# Patient Record
Sex: Female | Born: 1965 | Hispanic: Yes | Marital: Single | Smoking: Never smoker
Health system: Southern US, Community
[De-identification: ages and names within clinical notes are randomized; demographics above are authoritative.]

## PROBLEM LIST (undated history)

## (undated) ENCOUNTER — Ambulatory Visit (HOSPITAL_COMMUNITY): Admission: EM | Payer: Self-pay | Source: Home / Self Care

## (undated) DIAGNOSIS — J45909 Unspecified asthma, uncomplicated: Secondary | ICD-10-CM

## (undated) DIAGNOSIS — I10 Essential (primary) hypertension: Secondary | ICD-10-CM

## (undated) HISTORY — PX: OVARIAN CYST REMOVAL: SHX89

---

## 2019-09-15 ENCOUNTER — Encounter (HOSPITAL_COMMUNITY): Payer: Self-pay

## 2019-09-15 ENCOUNTER — Ambulatory Visit (INDEPENDENT_AMBULATORY_CARE_PROVIDER_SITE_OTHER): Payer: Managed Care, Other (non HMO)

## 2019-09-15 ENCOUNTER — Other Ambulatory Visit: Payer: Self-pay

## 2019-09-15 ENCOUNTER — Ambulatory Visit (HOSPITAL_COMMUNITY)
Admission: EM | Admit: 2019-09-15 | Discharge: 2019-09-15 | Disposition: A | Discharge status: Home / Self Care | Payer: Managed Care, Other (non HMO) | Attending: Family Medicine | Admitting: Family Medicine

## 2019-09-15 DIAGNOSIS — U071 COVID-19: Secondary | ICD-10-CM | POA: Insufficient documentation

## 2019-09-15 DIAGNOSIS — R05 Cough: Secondary | ICD-10-CM

## 2019-09-15 DIAGNOSIS — I10 Essential (primary) hypertension: Secondary | ICD-10-CM | POA: Insufficient documentation

## 2019-09-15 DIAGNOSIS — J4541 Moderate persistent asthma with (acute) exacerbation: Secondary | ICD-10-CM | POA: Diagnosis present

## 2019-09-15 DIAGNOSIS — R0602 Shortness of breath: Secondary | ICD-10-CM | POA: Diagnosis present

## 2019-09-15 DIAGNOSIS — R509 Fever, unspecified: Secondary | ICD-10-CM | POA: Diagnosis not present

## 2019-09-15 HISTORY — DX: Unspecified asthma, uncomplicated: J45.909

## 2019-09-15 HISTORY — DX: Essential (primary) hypertension: I10

## 2019-09-15 MED ORDER — ACETAMINOPHEN 325 MG PO TABS
975.0000 mg | ORAL_TABLET | Freq: Once | ORAL | Status: AC
  Administered 2019-09-15: 975 mg via ORAL

## 2019-09-15 MED ORDER — ACETAMINOPHEN 325 MG PO TABS
ORAL_TABLET | ORAL | Status: AC
  Filled 2019-09-15: qty 3

## 2019-09-15 MED ORDER — AZITHROMYCIN 250 MG PO TABS
250.0000 mg | ORAL_TABLET | Freq: Every day | ORAL | 0 refills | Status: DC
Start: 2019-09-15 — End: 2019-10-03

## 2019-09-15 MED ORDER — LISINOPRIL 10 MG PO TABS
10.0000 mg | ORAL_TABLET | Freq: Every day | ORAL | 0 refills | Status: DC
Start: 2019-09-15 — End: 2019-10-03

## 2019-09-15 MED ORDER — PREDNISONE 10 MG (21) PO TBPK
ORAL_TABLET | Freq: Every day | ORAL | 0 refills | Status: DC
Start: 2019-09-15 — End: 2019-10-03

## 2019-09-15 NOTE — ED Triage Notes (Signed)
Pt presents with severe non productive cough and shortness of breath X 1 week.

## 2019-09-15 NOTE — Discharge Instructions (Addendum)
You have been tested for COVID-19 today. °If your test returns positive, you will receive a phone call from [facility] regarding your results. °Negative test results are not called. °Both positive and negative results area always visible on MyChart. °If you do not have a MyChart account, sign up instructions are provided in your discharge papers. °Please do not hesitate to contact us should you have questions or concerns. ° °

## 2019-09-16 LAB — SARS CORONAVIRUS 2 (TAT 6-24 HRS): SARS Coronavirus 2: POSITIVE — AB

## 2019-09-17 ENCOUNTER — Telehealth (HOSPITAL_COMMUNITY): Payer: Self-pay | Admitting: Oncology

## 2019-09-17 ENCOUNTER — Other Ambulatory Visit (HOSPITAL_COMMUNITY): Payer: Self-pay | Admitting: Oncology

## 2019-09-17 ENCOUNTER — Encounter: Payer: Self-pay | Admitting: Oncology

## 2019-09-17 DIAGNOSIS — U071 COVID-19: Secondary | ICD-10-CM

## 2019-09-17 NOTE — ED Provider Notes (Signed)
[facility]   [id number] 09/15/19 Arrival Time: 1113  ASSESSMENT & PLAN:  1. Moderate persistent asthma with exacerbation   2. SOB (shortness of breath)   3. Essential hypertension     I have personally viewed the imaging studies ordered this visit. Haziness at bases.  High suspicion for COVID infection. Testing sent. Refilled HTN med at her request.  Meds ordered this encounter  Medications  . acetaminophen (TYLENOL) tablet 975 mg  . predniSONE (STERAPRED UNI-PAK 21 TAB) 10 MG (21) TBPK tablet    Sig: Take by mouth daily. Take as directed.    Dispense:  21 tablet    Refill:  0  . azithromycin (ZITHROMAX) 250 MG tablet    Sig: Take 1 tablet (250 mg total) by mouth daily. Take first 2 tablets together, then 1 every day until finished.    Dispense:  6 tablet    Refill:  0  . lisinopril (ZESTRIL) 10 MG tablet    Sig: Take 1 tablet (10 mg total) by mouth daily.    Dispense:  30 tablet    Refill:  0    Asthma precautions given. OTC symptom care as needed.  Recommend:  Follow-up Information    [facility] EMERGENCY DEPARTMENT.   Specialty: Emergency Medicine Why: If symptoms worsen in any way. Contact information: [address] [id number] mc [place] [address] [phone]               Discharge Instructions     You have been tested for COVID-19 today. If your test returns positive, you will receive a phone call from [facility] regarding your results. Negative test results are not called. Both positive and negative results area always visible on MyChart. If you do not have a MyChart account, sign up instructions are provided in your discharge papers. Please do not hesitate to contact us should you have questions or concerns.      Reviewed expectations re: course of current medical issues. Questions answered. Outlined signs and symptoms indicating need for more acute intervention. Patient verbalized  understanding. After Visit Summary given.  SUBJECTIVE: History from: patient.  Bianca Hernandez is a 54 y.o. female who presents with complaint of non-productive coughing and wheezing/SOB over the past week. Subjective fever. Fatigued. Body aches. Tolerating PO intake. No abdominal or chest pain reported. Ambulatory without difficulty. No OTC tx reported. Request refill of HTN medication.  Social History   Tobacco Use  Smoking Status Never Smoker     OBJECTIVE:  Vitals:   09/15/19 1228  BP: (!) 150/93  Pulse: 87  Temp: (!) 101.6 F (38.7 C)  TempSrc: Oral  SpO2: 92%    Temp noted. General appearance: alert; NAD but appears fatigued HEENT: [place]; AT; with nasal congestion Neck: supple without LAD Cv: RRR without murmer Lungs: unlabored respirations, mild bilateral expiratory wheezing; cough: moderate; no significant respiratory distress Skin: warm and dry Psychological: alert and cooperative; normal mood and affect  Imaging: DG Chest 2 View  Result Date: 09/15/2019 CLINICAL DATA:  Fever. Cough. Shortness of breath. Chronic asthma. Nonsmoker. EXAM: CHEST - 2 VIEW COMPARISON:  None. FINDINGS: Midline trachea. Borderline cardiomegaly. Mediastinal contours otherwise within normal limits. No pleural effusion or pneumothorax. Lower lung predominant interstitial thickening is mild. Subtle peripheral lower lobe pulmonary opacities, slightly greater on the left than right IMPRESSION: Subtle peripheral and basilar opacities, suspicious for pneumonia. Atypical/viral etiologies, including COVID-19 pneumonia are concerns. Underlying mild pulmonary interstitial thickening, likely related to the clinical  history of chronic asthma/bronchitis. Electronically Signed   By: Jeronimo Greaves M.D.   On: 09/15/2019 13:46    No Known Allergies  Past Medical History:  Diagnosis Date  . Asthma   . Hypertension    Family History  Family history unknown: Yes   Social History   Socioeconomic History  .  Marital status: Single    Spouse name: Not on file  . Number of children: Not on file  . Years of education: Not on file  . Highest education level: Not on file  Occupational History  . Not on file  Tobacco Use  . Smoking status: Never Smoker  Substance and Sexual Activity  . Alcohol use: Not on file  . Drug use: Not on file  . Sexual activity: Not on file  Other Topics Concern  . Not on file  Social History Narrative  . Not on file   Social Determinants of Health   Financial Resource Strain:   . Difficulty of Paying Living Expenses:   Food Insecurity:   . Worried About Programme researcher, broadcasting/film/video in the Last Year:   . Barista in the Last Year:   Transportation Needs:   . Freight forwarder (Medical):   Marland Kitchen Lack of Transportation (Non-Medical):   Physical Activity:   . Days of Exercise per Week:   . Minutes of Exercise per Session:   Stress:   . Feeling of Stress :   Social Connections:   . Frequency of Communication with Friends and Family:   . Frequency of Social Gatherings with Friends and Family:   . Attends Religious Services:   . Active Member of Clubs or Organizations:   . Attends Banker Meetings:   Marland Kitchen Marital Status:   Intimate Partner Violence:   . Fear of Current or Ex-Partner:   . Emotionally Abused:   Marland Kitchen Physically Abused:   . Sexually Abused:             Mardella Layman, MD 09/17/19 1048

## 2019-09-17 NOTE — Telephone Encounter (Signed)
I connected by phone with  Mrs. Lepak on 09/17/2019  at 2:45pm to discuss the potential use of an new treatment for mild to moderate COVID-19 viral infection in non-hospitalized patients.   This patient is a age/sex that meets the FDA criteria for Emergency Use Authorization of casirivimab\imdevimab.  Has a (+) direct SARS-CoV-2 viral test result 1. Has mild or moderate COVID-19  2. Is ? 54 years of age and weighs ? 40 kg 3. Is NOT hospitalized due to COVID-19 4. Is NOT requiring oxygen therapy or requiring an increase in baseline oxygen flow rate due to COVID-19 5. Is within 10 days of symptom onset 6. Has at least one of the high risk factor(s) for progression to severe COVID-19 and/or hospitalization as defined in EUA. ? Specific high risk criteria : Asthma severe   Symptom onset 09/12/2019.   I have spoken and communicated the following to the patient or parent/caregiver:   1. FDA has authorized the emergency use of casirivimab\imdevimab for the treatment of mild to moderate COVID-19 in adults and pediatric patients with positive results of direct SARS-CoV-2 viral testing who are 21 years of age and older weighing at least 40 kg, and who are at high risk for progressing to severe COVID-19 and/or hospitalization.   2. The significant known and potential risks and benefits of casirivimab\imdevimab, and the extent to which such potential risks and benefits are unknown.   3. Information on available alternative treatments and the risks and benefits of those alternatives, including clinical trials.   4. Patients treated with casirivimab\imdevimab should continue to self-isolate and use infection control measures (e.g., wear mask, isolate, social distance, avoid sharing personal items, clean and disinfect "high touch" surfaces, and frequent handwashing) according to CDC guidelines.    5. The patient or parent/caregiver has the option to accept or refuse casirivimab\imdevimab .   After  reviewing this information with the patient, The patient agreed to proceed with receiving casirivimab\imdevimab infusion and will be provided a copy of the Fact sheet prior to receiving the infusion.Mignon Pine, AGNP-C [phone] (Infusion Center Hotline)

## 2019-09-18 ENCOUNTER — Ambulatory Visit (HOSPITAL_COMMUNITY)
Admission: RE | Admit: 2019-09-18 | Discharge: 2019-09-18 | Disposition: A | Discharge status: Home / Self Care | Payer: Managed Care, Other (non HMO) | Source: Ambulatory Visit | Attending: Pulmonary Disease | Admitting: Pulmonary Disease

## 2019-09-18 DIAGNOSIS — U071 COVID-19: Secondary | ICD-10-CM | POA: Diagnosis not present

## 2019-09-18 MED ORDER — CLONIDINE HCL 0.1 MG PO TABS
0.1000 mg | ORAL_TABLET | Freq: Once | ORAL | Status: AC
  Administered 2019-09-18: 0.1 mg via ORAL
  Filled 2019-09-18: qty 1

## 2019-09-18 MED ORDER — SODIUM CHLORIDE 0.9 % IV SOLN
1200.0000 mg | Freq: Once | INTRAVENOUS | Status: AC
  Administered 2019-09-18: 1200 mg via INTRAVENOUS
  Filled 2019-09-18: qty 10

## 2019-09-18 MED ORDER — DIPHENHYDRAMINE HCL 50 MG/ML IJ SOLN: 50.0000 mg | Freq: Once | INTRAMUSCULAR | Status: DC | PRN

## 2019-09-18 MED ORDER — SODIUM CHLORIDE 0.9 % IV SOLN: INTRAVENOUS | Status: DC | PRN

## 2019-09-18 MED ORDER — METHYLPREDNISOLONE SODIUM SUCC 125 MG IJ SOLR: 125.0000 mg | Freq: Once | INTRAMUSCULAR | Status: DC | PRN

## 2019-09-18 MED ORDER — EPINEPHRINE 0.3 MG/0.3ML IJ SOAJ: 0.3000 mg | Freq: Once | INTRAMUSCULAR | Status: DC | PRN

## 2019-09-18 MED ORDER — FAMOTIDINE IN NACL 20-0.9 MG/50ML-% IV SOLN: 20.0000 mg | Freq: Once | INTRAVENOUS | Status: DC | PRN

## 2019-09-18 MED ORDER — ALBUTEROL SULFATE HFA 108 (90 BASE) MCG/ACT IN AERS: 2.0000 | INHALATION_SPRAY | Freq: Once | RESPIRATORY_TRACT | Status: DC | PRN

## 2019-09-18 NOTE — Progress Notes (Signed)
Patient arrived at [facility] with elevated B/P. Attending provider aware.Clonidine given per order.

## 2019-09-18 NOTE — Discharge Instructions (Signed)

## 2019-09-18 NOTE — Progress Notes (Signed)
  Diagnosis: COVID-19  Physician:Dr.Wright  Procedure: [facility] Infusion [facility] Med: casirivimab\imdevimab infusion - Provided patient with casirivimab\imdevimab fact sheet for patients, parents and caregivers prior to infusion.  Complications: No immediate complications noted.  Discharge: Discharged home   Bianca Hernandez 09/18/2019   

## 2019-09-18 NOTE — Progress Notes (Signed)
.   Diagnosis: COVID-19  Physician: Dr. Wright  Procedure: Covid Infusion Clinic Med: casirivimab\imdevimab infusion - Provided patient with casirivimab\imdevimab fact sheet for patients, parents and caregivers prior to infusion.  Complications: No immediate complications noted.  Discharge: Discharged home   Bianca Hernandez L 09/18/2019   

## 2019-09-20 ENCOUNTER — Emergency Department (HOSPITAL_COMMUNITY): Payer: Managed Care, Other (non HMO)

## 2019-09-20 ENCOUNTER — Emergency Department (HOSPITAL_COMMUNITY)
Admission: EM | Admit: 2019-09-20 | Discharge: 2019-09-20 | Disposition: A | Discharge status: Home / Self Care | Payer: Managed Care, Other (non HMO) | Attending: Emergency Medicine | Admitting: Emergency Medicine

## 2019-09-20 ENCOUNTER — Other Ambulatory Visit: Payer: Self-pay

## 2019-09-20 DIAGNOSIS — J45909 Unspecified asthma, uncomplicated: Secondary | ICD-10-CM | POA: Insufficient documentation

## 2019-09-20 DIAGNOSIS — Z7951 Long term (current) use of inhaled steroids: Secondary | ICD-10-CM | POA: Insufficient documentation

## 2019-09-20 DIAGNOSIS — U071 COVID-19: Secondary | ICD-10-CM | POA: Diagnosis not present

## 2019-09-20 DIAGNOSIS — I10 Essential (primary) hypertension: Secondary | ICD-10-CM | POA: Diagnosis not present

## 2019-09-20 LAB — BASIC METABOLIC PANEL
Anion gap: 10 (ref 5–15)
BUN: 15 mg/dL (ref 6–20)
CO2: 25 mmol/L (ref 22–32)
Calcium: 8.7 mg/dL — ABNORMAL LOW (ref 8.9–10.3)
Chloride: 102 mmol/L (ref 98–111)
Creatinine, Ser: 0.93 mg/dL (ref 0.44–1.00)
GFR calc Af Amer: >60 mL/min (ref >60)
GFR calc non Af Amer: >60 mL/min (ref >60)
Glucose, Bld: 105 mg/dL — ABNORMAL HIGH (ref 70–99)
Potassium: 4 mmol/L (ref 3.5–5.1)
Sodium: 137 mmol/L (ref 135–145)

## 2019-09-20 LAB — CBC
HCT: 44.2 % (ref 36.0–46.0)
Hemoglobin: 14.2 g/dL (ref 12.0–15.0)
MCH: 29.5 pg (ref 26.0–34.0)
MCHC: 32.1 g/dL (ref 30.0–36.0)
MCV: 91.9 fL (ref 80.0–100.0)
Platelets: 320 10*3/uL (ref 150–400)
RBC: 4.81 MIL/uL (ref 3.87–5.11)
RDW: 12.9 % (ref 11.5–15.5)
WBC: 15.4 10*3/uL — ABNORMAL HIGH (ref 4.0–10.5)
nRBC: 0 % (ref 0.0–0.2)

## 2019-09-20 MED ORDER — ALBUTEROL SULFATE HFA 108 (90 BASE) MCG/ACT IN AERS: 1.0000 | INHALATION_SPRAY | Freq: Four times a day (QID) | RESPIRATORY_TRACT | 0 refills | Status: DC | PRN

## 2019-09-20 MED ORDER — ACETAMINOPHEN 325 MG PO TABS
650.0000 mg | ORAL_TABLET | Freq: Once | ORAL | Status: AC
  Administered 2019-09-20: 650 mg via ORAL
  Filled 2019-09-20: qty 2

## 2019-09-20 NOTE — ED Notes (Signed)
Pt ambulated in room maintaining oxygen saturation from 93-96% on room air.

## 2019-09-20 NOTE — ED Notes (Signed)
Patient verbalizes understanding of discharge instructions. Opportunity for questioning and answers were provided. Armband removed by staff, pt discharged from ED ambulatory.   

## 2019-09-20 NOTE — ED Triage Notes (Signed)
Pt dx with covid on 8/14, taking prednisone. Pt reports SpO2 readings do not rise above 85% at home and endorses shallow breathing, headache, and lethargy.

## 2019-09-20 NOTE — ED Provider Notes (Signed)
[facility] EMERGENCY DEPARTMENT Provider Note   CSN: [id number] Arrival date & time: 09/20/19  1403     History Chief Complaint  Patient presents with   Shortness of Breath    Bianca Hernandez is a 54 y.o. female.  The history is provided by the patient.  Shortness of Breath Severity:  Mild Onset quality:  Gradual Duration:  5 days Timing:  Constant Progression:  Worsening Chronicity:  New Context: activity   Context comment:  COVID Relieved by:  Inhaler Worsened by:  Nothing Ineffective treatments:  None tried Associated symptoms: cough and fever   Associated symptoms: no abdominal pain, no chest pain, no ear pain, no rash, no sore throat and no vomiting        Past Medical History:  Diagnosis Date   Asthma    Hypertension     There are no problems to display for this patient.   Past Surgical History:  Procedure Laterality Date   CESAREAN SECTION     OVARIAN CYST REMOVAL       OB History   No obstetric history on file.     Family History  Family history unknown: Yes    Social History   Tobacco Use   Smoking status: Never Smoker  Substance Use Topics   Alcohol use: Not on file   Drug use: Not on file    Home Medications Prior to Admission medications   Medication Sig Start Date End Date Taking? Authorizing Provider  albuterol (VENTOLIN HFA) 108 (90 Base) MCG/ACT inhaler Inhale 1-2 puffs into the lungs every 6 (six) hours as needed for wheezing or shortness of breath. 09/20/19   Rickey Primus, MD  azithromycin (ZITHROMAX) 250 MG tablet Take 1 tablet (250 mg total) by mouth daily. Take first 2 tablets together, then 1 every day until finished. 09/15/19   Mardella Layman, MD  lisinopril (ZESTRIL) 10 MG tablet Take 1 tablet (10 mg total) by mouth daily. 09/15/19   Mardella Layman, MD  predniSONE (STERAPRED UNI-PAK 21 TAB) 10 MG (21) TBPK tablet Take by mouth daily. Take as directed. 09/15/19   Mardella Layman, MD    Allergies      Patient has no known allergies.  Review of Systems   Review of Systems  Constitutional: Positive for fever. Negative for chills.  HENT: Negative for ear pain and sore throat.   Eyes: Negative for pain and visual disturbance.  Respiratory: Positive for cough and shortness of breath.   Cardiovascular: Negative for chest pain and palpitations.  Gastrointestinal: Positive for nausea. Negative for abdominal pain and vomiting.  Genitourinary: Negative for dysuria and hematuria.  Musculoskeletal: Positive for myalgias. Negative for arthralgias and back pain.  Skin: Negative for color change and rash.  Neurological: Negative for seizures and syncope.  All other systems reviewed and are negative.   Physical Exam Updated Vital Signs BP (!) 131/91 (BP Location: Right Arm)    Pulse 88    Temp 99.5 F (37.5 C) (Oral)    Resp 20    Ht 5' (1.524 m)    Wt 97.1 kg    SpO2 95%    BMI 41.79 kg/m   Physical Exam Vitals and nursing note reviewed.  Constitutional:      General: She is not in acute distress.    Appearance: She is well-developed.  HENT:     Head: Normocephalic and atraumatic.  Eyes:     Conjunctiva/sclera: Conjunctivae normal.  Cardiovascular:     Rate and Rhythm:  Normal rate and regular rhythm.     Heart sounds: No murmur heard.   Pulmonary:     Effort: Pulmonary effort is normal. No respiratory distress.     Breath sounds: Normal breath sounds. No decreased breath sounds, wheezing, rhonchi or rales.  Abdominal:     Palpations: Abdomen is soft.     Tenderness: There is no abdominal tenderness.  Musculoskeletal:     Cervical back: Neck supple.     Right lower leg: No edema.     Left lower leg: No edema.  Skin:    General: Skin is warm and dry.     Capillary Refill: Capillary refill takes less than 2 seconds.  Neurological:     General: No focal deficit present.     Mental Status: She is alert and oriented to person, place, and time.     Motor: No weakness.   Psychiatric:        Mood and Affect: Mood normal.        Behavior: Behavior normal.     ED Results / Procedures / Treatments   Labs (all labs ordered are listed, but only abnormal results are displayed) Labs Reviewed  CBC - Abnormal; Notable for the following components:      Result Value   WBC 15.4 (*)    All other components within normal limits  BASIC METABOLIC PANEL - Abnormal; Notable for the following components:   Glucose, Bld 105 (*)    Calcium 8.7 (*)    All other components within normal limits    EKG EKG Interpretation  Date/Time:  Thursday September 20 2019 14:20:26 EDT Ventricular Rate:  102 PR Interval:  140 QRS Duration: 82 QT Interval:  354 QTC Calculation: 461 R Axis:   37 Text Interpretation: Sinus tachycardia Otherwise normal ECG No previous ECGs available Confirmed by Richardean Canal [phone]) on 09/20/2019 3:59:30 PM   Radiology DG Chest 2 View  Result Date: 09/20/2019 CLINICAL DATA:  Shortness of breath x4 days. EXAM: CHEST - 2 VIEW COMPARISON:  September 15, 2019 FINDINGS: Mild patchy infiltrates are seen along the periphery of the mid and lower lung fields. Mild diffuse chronic appearing increased lung markings are also noted. There is no evidence of a pleural effusion or pneumothorax. The heart size and mediastinal contours are within normal limits. The visualized skeletal structures are unremarkable. IMPRESSION: Mild patchy peripheral bilateral infiltrates. Electronically Signed   By: Aram Candela M.D.   On: 09/20/2019 15:49    Procedures Procedures (including critical care time)  Medications Ordered in ED Medications  acetaminophen (TYLENOL) tablet 650 mg (650 mg Oral Given 09/20/19 1437)    ED Course  I have reviewed the triage vital signs and the nursing notes.  Pertinent labs & imaging results that were available during my care of the patient were reviewed by me and considered in my medical decision making (see chart for details).    MDM  Rules/Calculators/A&P                          54 year old female with history of asthma presents with shortness of breath in setting of recent Covid diagnosis. Patient states that she was diagnosed with Covid approximately 5 days ago and since then has been having worsening shortness of breath as well as lethargy. States that she received a monoclonal antibody yesterday and today she finished up a prednisone pack. Patient states that she has been using her albuterol inhaler more  frequently which helps with her shortness of breath. Denies any chest pain, vomiting, diarrhea, lower extremity edema.    Temperature 100.5 in the waiting room. Heart rate low 100s. Remainder vital signs stable. Exam as above. Patient overall well-appearing currently not on oxygen. White count 15.4. Remainder of CBC, BMP unremarkable. Chest x-ray notable for mild patchy bilateral interstitial infiltrates. EKG showed sinus tachycardia thoughts of ischemia or arrhythmia. Plan to ambulate patient in her room and observe oxygen saturations and make final disposition recommendation.  Patient was ambulated around her room and her oxygen saturations in the mid 90s.  Spoke with case management regarding this patient however she does not qualify for Covid at home therapy given that she has no oxygen requirement.  Feel at this time the patient stable for discharge home given okay labs and no O2 requirement.  Patient has already completed a course of steroids as well as monoclonal antibody infusion.  Discussed this with the patient and she agreed with this plan however she requested a refill of her albuterol medication which I feel is reasonable.  Albuterol prescription written for the patient and strict ED return precautions were discussed with the patient.  Patient voiced agreement and understand the overall plan.  Patient was discharged stable condition without further events.   Final Clinical Impression(s) / ED Diagnoses Final diagnoses:   COVID-19    Rx / DC Orders ED Discharge Orders         Ordered    albuterol (VENTOLIN HFA) 108 (90 Base) MCG/ACT inhaler  Every 6 hours PRN        09/20/19 1806           Rickey Primus, MD 09/20/19 1809    Charlynne Pander, MD 09/20/19 2255

## 2019-09-20 NOTE — Discharge Instructions (Signed)
Please continue self her mother's for approximately 10 days from the start of your symptoms.  Please continue using your albuterol inhaler at home as needed for shortness of breath and please return the emerge department if you have worsening chest pain or shortness of breath.

## 2019-10-03 ENCOUNTER — Other Ambulatory Visit: Payer: Self-pay

## 2019-10-03 ENCOUNTER — Encounter: Payer: Self-pay | Admitting: Internal Medicine

## 2019-10-03 ENCOUNTER — Ambulatory Visit (INDEPENDENT_AMBULATORY_CARE_PROVIDER_SITE_OTHER): Payer: Managed Care, Other (non HMO)

## 2019-10-03 ENCOUNTER — Ambulatory Visit (INDEPENDENT_AMBULATORY_CARE_PROVIDER_SITE_OTHER): Payer: Managed Care, Other (non HMO) | Admitting: Internal Medicine

## 2019-10-03 DIAGNOSIS — J45991 Cough variant asthma: Secondary | ICD-10-CM | POA: Insufficient documentation

## 2019-10-03 DIAGNOSIS — U071 COVID-19: Secondary | ICD-10-CM | POA: Insufficient documentation

## 2019-10-03 DIAGNOSIS — J1282 Pneumonia due to coronavirus disease 2019: Secondary | ICD-10-CM | POA: Diagnosis not present

## 2019-10-03 DIAGNOSIS — I10 Essential (primary) hypertension: Secondary | ICD-10-CM

## 2019-10-03 MED ORDER — VALSARTAN 80 MG PO TABS: 80.0000 mg | ORAL_TABLET | Freq: Every day | ORAL | 11 refills | Status: DC

## 2019-10-03 NOTE — Patient Instructions (Addendum)
Plan A = Automatic = Always=    Breztri Take 0- 2 puffs first thing in am and then another 2 puffs about 12 hours later thru spacer   Work on inhaler technique:  relax and gently blow all the way out then take a nice smooth deep breath back in, triggering the inhaler  A second before  you start breathing in thru spacer.  Hold for up to 5 seconds if you can. Blow out thru nose. Rinse and gargle with water when done     Plan B = Backup (to supplement plan A, not to replace it) Only use your albuterol (ventolin) inhaler as a rescue medication to be used if you can't catch your breath by resting or doing a relaxed purse lip breathing pattern.  - The less you use it, the better it will work when you need it. - Ok to use the inhaler up to 2 puffs  every 4 hours if you must but call for appointment if use goes up over your usual need - Don't leave home without it !!  (think of it like the spare tire for your car)    Plan C = Crisis (instead of Plan B but only if Plan B stops working) - only use your albuterol nebulizer if you first try Plan B and it fails to help > ok to use the nebulizer up to every 4 hours but if start needing it regularly call for immediate appointment  If increased need for nebulizer > Prednisone 10 mg take  4 each am x 2 days,   2 each am x 2 days,  1 each am x 2 days and stop   Please remember to go to the  x-ray department  for your tests - we will call you with the results when they are available    Stop lisinopril and start valsartan 80 mg daily   Try prilosec otc 20mg   Take 30-60 min before first meal of the day and Pepcid ac (famotidine) 20 mg one @  bedtime until cough is completely gone for at least a week without the need for cough suppression   GERD (REFLUX)  is an extremely common cause of respiratory symptoms just like yours , many times with no obvious heartburn at all.    It can be treated with medication, but also with lifestyle changes including elevation of  the head of your bed (ideally with 6 -8inch blocks under the headboard of your bed),  Smoking cessation, avoidance of late meals, excessive alcohol, and avoid fatty foods, chocolate, peppermint, colas, red wine, and acidic juices such as orange juice.  NO MINT OR MENTHOL PRODUCTS SO NO COUGH DROPS  USE SUGARLESS CANDY INSTEAD (Jolley ranchers or Stover's or Life Savers) or even ice chips will also do - the key is to swallow to prevent all throat clearing. NO OIL BASED VITAMINS - use powdered substitutes.  Avoid fish oil when coughing.  Best cough syrup is delsym  2 tsp every 12 hours as needed   Please remember to go to the  x-ray department  for your tests - we will call you with the results when they are available   Please schedule a follow up office visit in 6 weeks, call sooner if needed  LATE ADD: change breztri to symb 80 Take 2 puffs first thing in am and then another 2 puffs about 12 hours later as this is covered best by insurance and least likely to make her cough

## 2019-10-03 NOTE — Assessment & Plan Note (Addendum)
Onset of symptoms Sep 11 2019 and tested Pos Sep 15 2019 rx pred/zpak then 09/18/19 casirivimab\imdevimab infusion  Marked serial improvement, no further cxr's needed nor any additional need for prednisone for FP changes at this point as stable off pred > 1 weeks

## 2019-10-03 NOTE — Assessment & Plan Note (Signed)
Onset childhood previously controlled on symb 160 2bid prior to covid - D/c acei 10/03/2019  And rx gerd short term   DDX of  difficult airways management almost all start with A and  include Adherence, Ace Inhibitors, Acid Reflux, Active Sinus Disease, Alpha 1 Antitripsin deficiency, Anxiety masquerading as Airways dz,  ABPA,  Allergy(esp in young), Aspiration (esp in elderly), Adverse effects of meds,  Active smoking or vaping, A bunch of PE's (a small clot burden can't cause this syndrome unless there is already severe underlying pulm or vascular dz with poor reserve) plus two Bs  = Bronchiectasis and Beta blocker use..and one C= CHF  Adherence is always the initial "prime suspect" and is a multilayered concern that requires a "trust but verify" approach in every patient - starting with knowing how to use medications, especially inhalers, correctly, keeping up with refills and understanding the fundamental difference between maintenance and prns vs those medications only taken for a very short course and then stopped and not refilled.  - reviewed hfa technique, emphasized slow deep breath with spacer since coughing   ACEi adverse effects at the  top of the rest of the usual list of suspects and the only way to rule it out is a trial off > see a/p    ? Acid (or non-acid) GERD > always difficult to exclude as up to 75% of pts in some series report no assoc GI/ Heartburn symptoms> rec max (24h)  acid suppression and diet restrictions/ reviewed and instructions given in writing.  - Of the three most common causes of  Sub-acute / recurrent or chronic cough, only one (GERD)  can actually contribute to/ trigger  the other two (asthma and post nasal drip syndrome)  and perpetuate the cylce of cough.  While not intuitively obvious, many patients with chronic low grade reflux do not cough until there is a primary insult that disturbs the protective epithelial barrier and exposes sensitive nerve endings.   This  is typically post viral as applies  here.   The point is that once this occurs, it is difficult to eliminate the cycle  using anything but a maximally effective acid suppression regimen at least in the short run, accompanied by an appropriate diet to address non acid GERD and control / eliminate the cough itself for at least 3 days with delsym / hard rock candies   ? Allergy > should be fine with low dose ICS though note in new enviroment since arriving in [facility] 09/02/19  - re saba: I spent extra time with pt today reviewing appropriate use of albuterol for prn use on exertion with the following points: 1) saba is for relief of sob that does not improve by walking a slower pace or resting but rather if the pt does not improve after trying this first. 2) If the pt is convinced, as many are, that saba helps recover from activity faster then it's easy to tell if this is the case by re-challenging : ie stop, take the inhaler, then p 5 minutes try the exact same activity (intensity of workload) that just caused the symptoms and see if they are substantially diminished or not after saba 3) if there is an activity that reproducibly causes the symptoms, try the saba 15 min before the activity on alternate days   If in fact the saba really does help, then fine to continue to use it prn but advised may need to look closer at the maintenance regimen being  used to achieve better control of airways disease with exertion.    F/u in 6 weeks, sooner prn

## 2019-10-03 NOTE — Assessment & Plan Note (Signed)
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   In the best review of chronic cough to date ( NEJM 2016 375 5329-9242) ,  ACEi are now felt to cause cough in up to  20% of pts which is a 4 fold increase from previous reports and does not include the variety of non-specific complaints we see in pulmonary clinic in pts on ACEi but previously attributed to another dx like  Copd/asthma and  include PNDS, throat and chest congestion, "bronchitis", unexplained dyspnea and noct "strangling" sensations, and hoarseness, but also  atypical /refractory GERD symptoms like dysphagia and "bad heartburn"   The only way I know  to prove this is not an "ACEi Case" is a trial off ACEi x a minimum of 6 weeks then regroup.   >>> try valsartan 80 mg one daily    Medical decision making was a moderate level of complexity in this case because of >  Two refractory conditions /diagnoses requiring extra time for  H and P, chart review, counseling, teaching hfa and generating customized AVS unique to this office visit and charting.   Each maintenance medication was reviewed in detail including emphasizing most importantly the difference between maintenance and prns and under what circumstances the prns are to be triggered using an action plan format where appropriate. Please see avs for details which were reviewed in writing by both me and my nurse and patient given a written copy highlighted where appropriate with yellow highlighter for the patient's continued care at home along with an updated version of their medications.  Patient was asked to maintain medication reconciliation by comparing this list to the actual medications being used at home and to contact this office right away if there is a conflict or discrepancy.

## 2019-10-03 NOTE — Progress Notes (Addendum)
Bianca Hernandez, female    DOB: [birth year],   MRN: [id number]   Brief patient profile:  54 yo never regular smoker raised [place] asthma [place] went to [place] in [place] on prn saba > saw pulmonary Cathlean [place]  Around 2011 best rx symbicort and arrived Aug 1 to [place] with onset Sep 11 2019 fever HA  Dx as covid rx pred/ zpak  Then infusion on 8/17 and gradually improved  But could not stay on the breztri due to cough so just uses neb at hs      History of Present Illness  10/03/2019  Pulmonary/ 1st office eval/[facility]  ON breztri  Off prednisone x one weeks prior to OV   Chief Complaint  Patient presents with  . Consult    pt is here to establish with pulmonolgist takes meds to tx syms  Dyspnea:  Great until unable to use breztri substitute for symb 160 Cough: white mucus Sleep: fine  SABA use: nebulizer at bedtime  02 none  No obvious day to day or daytime variability or assoc  purulent sputum or mucus plugs or hemoptysis or cp or chest tightness, subjective wheeze or overt sinus or hb symptoms.   Sleeping without nocturnal  or early am exacerbation  of respiratory  c/o's or need for noct saba. Also denies any obvious fluctuation of symptoms with weather or environmental changes or other aggravating or alleviating factors except as outlined above   No unusual exposure hx or h/o childhood pna/ asthma or knowledge of premature birth.  Current Allergies, Complete Past Medical History, Past Surgical History, Family History, and Social History were reviewed in [employer] record.  ROS  The following are not active complaints unless bolded Hoarseness, sore throat, dysphagia, dental problems, itching, sneezing,  nasal congestion or discharge of excess mucus or purulent secretions, ear ache,   fever, chills, sweats, unintended wt loss or wt gain, classically pleuritic or exertional cp,  orthopnea pnd or arm/hand swelling  or leg swelling, presyncope, palpitations, abdominal pain,  anorexia, nausea, vomiting, diarrhea  or change in bowel habits or change in bladder habits, change in stools or change in urine, dysuria, hematuria,  rash, arthralgias, visual complaints, headache, numbness, weakness or ataxia or problems with walking or coordination,  change in mood or  memory.           Past Medical History:  Diagnosis Date  . Asthma   . Hypertension     Outpatient Medications Prior to Visit  Medication Sig Dispense Refill  . albuterol (VENTOLIN HFA) 108 (90 Base) MCG/ACT inhaler Inhale 1-2 puffs into the lungs every 6 (six) hours as needed for wheezing or shortness of breath. 8 g 0  . lisinopril (ZESTRIL) 10 MG tablet Take 1 tablet (10 mg total) by mouth daily. 30 tablet 0  . azithromycin (ZITHROMAX) 250 MG tablet Take 1 tablet (250 mg total) by mouth daily. Take first 2 tablets together, then 1 every day until finished. 6 tablet 0  . predniSONE (STERAPRED UNI-PAK 21 TAB) 10 MG (21) TBPK tablet Take by mouth daily. Take as directed. 21 tablet 0   No facility-administered medications prior to visit.     Objective:     BP 122/74 (BP Location: Left Arm, Cuff Size: Normal)   Pulse 93   Temp (!) 97.1 F (36.2 C) (Oral)   Ht 5\' 6"  (1.676 m)   Wt 199 lb 9.6 oz (90.5 kg)   SpO2 97%   BMI 32.22 kg/m  SpO2: 97 %  RA  Pleasant amb female nad   HEENT : pt wearing mask not removed for exam due to covid -19 concerns.    NECK :  without JVD/Nodes/TM/ nl carotid upstrokes bilaterally   LUNGS: no acc muscle use,  Nl contour chest which is clear to A and P bilaterally without cough on insp or exp maneuvers   CV:  RRR  no s3 or murmur or increase in P2, and no edema   ABD:  soft and nontender with nl inspiratory excursion in the supine position. No bruits or organomegaly appreciated, bowel sounds nl  MS:  Nl gait/ ext warm without deformities, calf tenderness, cyanosis or clubbing No obvious joint restrictions   SKIN: warm and dry without lesions    NEURO:   alert, approp, nl sensorium with  no motor or cerebellar deficits apparent.    CXR PA and Lateral:   10/03/2019 :    I personally reviewed images and  impression as follows:   Minimal residual infiltrates vs prior studies        Assessment   Pneumonia due to COVID-19 virus Onset of symptoms Sep 11 2019 and tested [facility] Sep 15 2019 rx pred/zpak then 09/18/19 casirivimab\imdevimab infusion  Marked serial improvement, no further cxr's needed nor any additional need for prednisone for FP changes at this point as stable off pred > 1 weeks     Cough variant asthma Onset childhood previously controlled on symb 160 2bid prior to covid - D/c acei 10/03/2019  And rx gerd short term   DDX of  difficult airways management almost all start with A and  include Adherence, Ace Inhibitors, Acid Reflux, Active Sinus Disease, Alpha 1 Antitripsin deficiency, Anxiety masquerading as Airways dz,  ABPA,  Allergy(esp in young), Aspiration (esp in elderly), Adverse effects of meds,  Active smoking or vaping, A bunch of PE's (a small clot burden can't cause this syndrome unless there is already severe underlying pulm or vascular dz with poor reserve) plus two Bs  = Bronchiectasis and Beta blocker use..and one C= CHF  Adherence is always the initial "prime suspect" and is a multilayered concern that requires a "trust but verify" approach in every patient - starting with knowing how to use medications, especially inhalers, correctly, keeping up with refills and understanding the fundamental difference between maintenance and prns vs those medications only taken for a very short course and then stopped and not refilled.  - reviewed hfa technique, emphasized slow deep breath with spacer since coughing   ACEi adverse effects at the  top of the rest of the usual list of suspects and the only way to rule it out is a trial off > see a/p    ? Acid (or non-acid) GERD > always difficult to exclude as up to 75% of pts in some  series report no assoc GI/ Heartburn symptoms> rec max (24h)  acid suppression and diet restrictions/ reviewed and instructions given in writing.  - Of the three most common causes of  Sub-acute / recurrent or chronic cough, only one (GERD)  can actually contribute to/ trigger  the other two (asthma and post nasal drip syndrome)  and perpetuate the cylce of cough.  While not intuitively obvious, many patients with chronic low grade reflux do not cough until there is a primary insult that disturbs the protective epithelial barrier and exposes sensitive nerve endings.   This is typically post viral as applies  here.   The point is  that once this occurs, it is difficult to eliminate the cycle  using anything but a maximally effective acid suppression regimen at least in the short run, accompanied by an appropriate diet to address non acid GERD and control / eliminate the cough itself for at least 3 days with delsym / hard rock candies   ? Allergy > should be fine with low dose ICS though note in new enviroment since arriving in [facility] 09/02/19  - re saba: I spent extra time with pt today reviewing appropriate use of albuterol for prn use on exertion with the following points: 1) saba is for relief of sob that does not improve by walking a slower pace or resting but rather if the pt does not improve after trying this first. 2) If the pt is convinced, as many are, that saba helps recover from activity faster then it's easy to tell if this is the case by re-challenging : ie stop, take the inhaler, then p 5 minutes try the exact same activity (intensity of workload) that just caused the symptoms and see if they are substantially diminished or not after saba 3) if there is an activity that reproducibly causes the symptoms, try the saba 15 min before the activity on alternate days   If in fact the saba really does help, then fine to continue to use it prn but advised may need to look closer at the maintenance regimen  being used to achieve better control of airways disease with exertion.    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   In the best review of chronic cough to date ( NEJM 2016 375 1610-9604) ,  ACEi are now felt to cause cough in up to  20% of pts which is a 4 fold increase from previous reports and does not include the variety of non-specific complaints we see in pulmonary clinic in pts on ACEi but previously attributed to another dx like  Copd/asthma and  include PNDS, throat and chest congestion, "bronchitis", unexplained dyspnea and noct "strangling" sensations, and hoarseness, but also  atypical /refractory GERD symptoms like dysphagia and "bad heartburn"   The only way I know  to prove this is not an "ACEi Case" is a trial off ACEi x a minimum of 6 weeks then regroup.   >>> try valsartan 80 mg one daily     Medical decision making was a moderate level of complexity in this case because of >  Two refractory conditions /diagnoses requiring extra time for  H and P, chart review, counseling, teaching hfa and generating customized AVS unique to this office visit and charting.   Each maintenance medication was reviewed in detail including emphasizing most importantly the difference between maintenance and prns and under what circumstances the prns are to be triggered using an action plan format where appropriate. Please see avs for details which were reviewed in writing by both me and my nurse and patient given a written copy highlighted where appropriate with yellow highlighter for the patient's continued care at home along with an updated version of their medications.  Patient was asked to maintain medication reconciliation by comparing this list to the actual medications being used at home and to contact this office right away if there is a conflict or discrepancy.        Sandrea Hughs, MD 10/03/2019

## 2019-10-04 ENCOUNTER — Telehealth: Payer: Self-pay | Admitting: Pharmacist

## 2019-10-04 NOTE — Telephone Encounter (Signed)
Unable to run test claims due to pharmacy lockout. Will try to reach out to plan tomorrow.  Phone# [phone]

## 2019-10-04 NOTE — Telephone Encounter (Signed)
-----   Message from Nyoka Cowden, MD sent at 10/03/2019  4:34 PM EDT ----- What is the cost of symbicort on her plan and what are the alternatives

## 2019-10-05 ENCOUNTER — Telehealth: Payer: Self-pay | Admitting: Internal Medicine

## 2019-10-05 MED ORDER — BUDESONIDE-FORMOTEROL FUMARATE 80-4.5 MCG/ACT IN AERO: 2.0000 | INHALATION_SPRAY | Freq: Two times a day (BID) | RESPIRATORY_TRACT | 2 refills | Status: DC

## 2019-10-05 NOTE — Telephone Encounter (Signed)
I spoke with the pt and notified of recs per Dr Sherene Sires and she verbalized understanding  I have sent the symbicort in for her  Nothing further needed

## 2019-10-05 NOTE — Telephone Encounter (Signed)
Patient is returning phone call. Patient phone number is [phone].

## 2019-10-05 NOTE — Addendum Note (Signed)
Addended by: Sandrea Hughs B on: 10/05/2019 01:14 PM   Modules accepted: Orders

## 2019-10-05 NOTE — Telephone Encounter (Signed)
Called plan, preferred agents are Symbicort and Dulera. Rep estimates cost would be $30 for 1 month supply for each med.  Phone# [phone]

## 2019-10-05 NOTE — Telephone Encounter (Signed)
Duplicate msg See phone note dated 10/04/19

## 2019-10-05 NOTE — Telephone Encounter (Signed)
Tried calling pt and there was no answer- LMTCB.  

## 2019-10-05 NOTE — Telephone Encounter (Signed)
Let pt know best choice for her insurance and least likely to make her cough is symbicort 80 2bid available in a generic that is identical to the brand product with a different label on it   rx  Take 2 puffs first thing in am and then another 2 puffs about 12 hours later.    give 2 refills

## 2019-10-05 NOTE — Progress Notes (Signed)
Called pt and there was no answer-LMTCB °

## 2019-10-09 NOTE — Progress Notes (Signed)
LMTCB

## 2019-10-12 NOTE — Progress Notes (Signed)
Patient returned call, provided results of cxr per Dr. Sherene Sires.  She verbalized understanding.  Nothing further needed.

## 2019-10-19 NOTE — Telephone Encounter (Signed)
error 

## 2019-11-14 ENCOUNTER — Ambulatory Visit: Payer: Managed Care, Other (non HMO) | Admitting: Internal Medicine

## 2019-12-11 ENCOUNTER — Ambulatory Visit: Payer: Managed Care, Other (non HMO) | Admitting: Internal Medicine

## 2019-12-11 NOTE — Progress Notes (Deleted)
   Bianca Hernandez, female    DOB: 1965/06/03,   MRN: [id number]   Brief patient profile:  54yo never regular smoker raised [place] asthma [place] child went to college in [place] on prn saba > saw pulmonary Bianca Hernandez  Around 2011 best rx symbicort and arrived Aug 1 to [place] with onset Sep 11 2019 fever HA  Dx as covid rx pred/ zpak  Then infusion on 8/17 and gradually improved  But could not stay on the breztri due to cough so just uses neb at hs      History of Present Illness  10/03/2019  Pulmonary/ 1st office eval/Bianca Hernandez  ON breztri  Off prednisone x one weeks prior to OV   Chief Complaint  Patient presents with  . Consult    pt is here to establish with pulmonolgist takes meds to tx syms  Dyspnea:  Great until unable to use breztri substitute for symb 160 Cough: white mucus Sleep: fine  SABA use: nebulizer at bedtime  02 none rec Plan A = Automatic = Always=    Breztri Take 0- 2 puffs first thing in am and then another 2 puffs about 12 hours later thru spacer  Work on inhaler technique:  Plan B = Backup (to supplement plan A, not to replace it) Only use your albuterol (ventolin) inhaler as a rescue medication Plan C = Crisis (instead of Plan B but only if Plan B stops working) - only use your albuterol nebulizer if you first try Plan B If increased need for nebulizer > Prednisone 10 mg take  4 each am x 2 days,   2 each am x 2 days,  1 each am x 2 days and stop  Please remember to go to the  x-ray department  for your tests - we will call you with the results when they are available   Stop lisinopril and start valsartan 80 mg daily  Try prilosec otc 20mg   Take 30-60 min before first meal of the day and Pepcid ac (famotidine) 20 mg one @  bedtime until cough is completely gone for at least a week without the need for cough suppression GERD diet . Best cough syrup is delsym  2 tsp every 12 hours as needed  Please schedule a follow up office visit in 6 weeks, call sooner if needed  LATE ADD:  change breztri to symb 80 Take 2 puffs first thing in am and then another 2 puffs about 12 hours later as this is covered best by insurance and least likely to make her cough         Past Medical History:  Diagnosis Date  . Asthma   . Hypertension        Objective:       Wt Readings from Last 3 Encounters:  10/03/19 199 lb 9.6 oz (90.5 kg)  09/20/19 214 lb (97.1 kg)     Vital signs reviewed - Note on arrival 12/11/2019  02 sats  ***% on ***        Assessment

## 2019-12-30 ENCOUNTER — Other Ambulatory Visit: Payer: Self-pay | Admitting: Internal Medicine

## 2020-04-03 ENCOUNTER — Other Ambulatory Visit: Payer: Self-pay | Admitting: Internal Medicine

## 2020-04-04 ENCOUNTER — Other Ambulatory Visit: Payer: Self-pay | Admitting: Internal Medicine

## 2020-04-04 MED ORDER — ALBUTEROL SULFATE HFA 108 (90 BASE) MCG/ACT IN AERS
1.0000 | INHALATION_SPRAY | Freq: Four times a day (QID) | RESPIRATORY_TRACT | 0 refills | Status: DC | PRN
Start: 2020-04-04 — End: 2020-04-21

## 2020-04-20 ENCOUNTER — Other Ambulatory Visit: Payer: Self-pay | Admitting: Internal Medicine

## 2020-08-26 ENCOUNTER — Other Ambulatory Visit: Payer: Self-pay

## 2020-08-26 MED ORDER — BUDESONIDE-FORMOTEROL FUMARATE 80-4.5 MCG/ACT IN AERO: 2.0000 | INHALATION_SPRAY | Freq: Two times a day (BID) | RESPIRATORY_TRACT | 2 refills | Status: DC

## 2020-11-20 ENCOUNTER — Other Ambulatory Visit: Payer: Self-pay | Admitting: Internal Medicine

## 2021-01-01 ENCOUNTER — Encounter (HOSPITAL_COMMUNITY): Payer: Self-pay | Admitting: Emergency Medicine

## 2021-01-01 ENCOUNTER — Ambulatory Visit (HOSPITAL_COMMUNITY)
Admission: EM | Admit: 2021-01-01 | Discharge: 2021-01-01 | Disposition: A | Discharge status: Home / Self Care | Payer: BC Managed Care – PPO | Attending: Emergency Medicine | Admitting: Emergency Medicine

## 2021-01-01 ENCOUNTER — Other Ambulatory Visit: Payer: Self-pay

## 2021-01-01 DIAGNOSIS — R600 Localized edema: Secondary | ICD-10-CM | POA: Diagnosis not present

## 2021-01-01 DIAGNOSIS — I10 Essential (primary) hypertension: Secondary | ICD-10-CM | POA: Diagnosis not present

## 2021-01-01 NOTE — Discharge Instructions (Addendum)
I am concerned that you are having damage to your kidneys from uncontrolled blood pressure, and that is what is causing the swelling in your legs to worsen.  Please go to the emergency department immediately for evaluation.  [facility] or [facility] is fine.  Let them know if you start having headache, chest pain, shortness of breath, arm or leg weakness, facial droop, slurred speech.

## 2021-01-01 NOTE — ED Triage Notes (Signed)
Pt is present today with bilateral leg swelling. Pt states her sx started over a month ago.

## 2021-01-01 NOTE — ED Provider Notes (Signed)
HPI  SUBJECTIVE:  Bianca Hernandez is a 55 y.o. female who presents with Worsening bilateral lower extremity edema, pulsating distal extremity pain, erythema over the past week.  She has had issues with bilateral lower extremity edema for years, and distal erythema for the past year.  She states the swelling is no longer responding to her usual measures of compression socks and elevating her legs.  She states that the erythema has gotten worse over the past week.  She has not tried anything else for this.  No alleviating factors, symptoms are worse with sitting for prolonged periods of time.  She is concerned about a DVT.  She states that she has been sitting for hours at a time for the past few months at her job.  She currently denies headache, blurry vision, visual changes, slurred speech, facial droop, arm or leg weakness, discoordination, chest pain, pressure or heaviness, shortness of breath, palpitations, dizziness, pain tearing through to her back, anuria, hematuria, syncope, seizures, abdominal pain.  She has a past medical history of asthma, hypertension.  She does not regularly take her blood pressure medications.  She has not taken them in the past 3 days.  She has not measured her blood pressure at home in the past 5 weeks.  She also has a history of COVID in 2021.  No history of DVT, cancer, hypercoagulability.  PMD: None.  Past Medical History:  Diagnosis Date   Asthma    Hypertension     Past Surgical History:  Procedure Laterality Date   CESAREAN SECTION     OVARIAN CYST REMOVAL      Family History  Family history unknown: Yes    Social History   Tobacco Use   Smoking status: Never   Smokeless tobacco: Never    No current facility-administered medications for this encounter.  Current Outpatient Medications:    albuterol (VENTOLIN HFA) 108 (90 Base) MCG/ACT inhaler, INHALE 1-2 PUFFS BY MOUTH EVERY 6 HOURS AS NEEDED FOR WHEEZE OR SHORTNESS OF BREATH, Disp: 8.5 each, Rfl: 2    budesonide-formoterol (SYMBICORT) 80-4.5 MCG/ACT inhaler, Inhale 2 puffs into the lungs 2 (two) times daily. in the morning and at bedtime., Disp: 10.2 each, Rfl: 2   valsartan (DIOVAN) 80 MG tablet, Take 1 tablet (80 mg total) by mouth daily., Disp: 30 tablet, Rfl: 11  No Known Allergies   ROS  As noted in HPI.   Physical Exam  BP (!) 231/139   Pulse 85   Temp 97.9 F (36.6 C) (Oral)   Resp 18   SpO2 99%   Constitutional: Well developed, well nourished, no acute distress Eyes:  EOMI, conjunctiva normal bilaterally HENT: Normocephalic, atraumatic,mucus membranes moist Respiratory: Normal inspiratory effort, lungs clear bilaterally Cardiovascular: Normal rate, regular rhythm, no murmurs rubs or gallops GI: nondistended skin: No rash, skin intact Musculoskeletal: Trace bilateral lower extremity edema.  Left calf slightly larger than the right.  Positive tender, blanchable erythema distally.  No popliteal, medial thigh tenderness, palpable popliteal cord.  PT 2+ and equal bilaterally Neurologic: Alert & oriented x 3, no focal neuro deficits, speech clear, moving all extremities equally Psychiatric: Speech and behavior appropriate   ED Course   Medications - No data to display  No orders of the defined types were placed in this encounter.   No results found for this or any previous visit (from the past 24 hour(s)). No results found.  ED Clinical Impression  1. Accelerated hypertension   2. Bilateral lower extremity edema  ED Assessment/Plan  Patient with bilateral lower extremity edema that is not responding to usual measures.  We do not have any information about what her blood pressure has been running over the past 5 weeks.  She has not taken her blood pressure medication in the past 3 days.  Concern for endorgan damage with the worsening lower extremity edema.  Doubt bilateral DVT.  The distal erythema appears to be more like venous stasis.   Patient otherwise  has no other symptoms of a hypertensive emergency.  Transferring to the ED for work-up to rule out endorgan damage, blood pressure reduction if necessary.  She is stable to go by private vehicle as she she has no stroke or ACS symptoms.  Discussed rationale for transfer to the emergency department with patient.  She agrees to go.    No orders of the defined types were placed in this encounter.     *This clinic note was created using Dragon dictation software. Therefore, there may be occasional mistakes despite careful proofreading.  ?    Domenick Gong, MD 01/01/21 1434

## 2021-01-01 NOTE — ED Notes (Signed)
Patient is being discharged from the Urgent Care and sent to the Emergency Department via POV . Per Domenick Gong MD , patient is in need of higher level of care due to HTN and bilateral leg swelling. Patient is aware and verbalizes understanding of plan of care.  Vitals:   01/01/21 1309 01/01/21 1310  BP: (!) 222/130 (!) 231/139  Pulse: 85   Resp: 18   Temp: 97.9 F (36.6 C)   SpO2: 99%

## 2021-01-02 ENCOUNTER — Other Ambulatory Visit: Payer: Self-pay

## 2021-01-02 ENCOUNTER — Emergency Department (HOSPITAL_COMMUNITY): Payer: BC Managed Care – PPO

## 2021-01-02 ENCOUNTER — Encounter (HOSPITAL_COMMUNITY): Payer: Self-pay

## 2021-01-02 ENCOUNTER — Observation Stay (HOSPITAL_COMMUNITY)
Admission: EM | Admit: 2021-01-02 | Discharge: 2021-01-03 | Disposition: A | Discharge status: Home / Self Care | Payer: BC Managed Care – PPO | Attending: Emergency Medicine | Admitting: Emergency Medicine

## 2021-01-02 DIAGNOSIS — J45991 Cough variant asthma: Secondary | ICD-10-CM | POA: Diagnosis present

## 2021-01-02 DIAGNOSIS — R079 Chest pain, unspecified: Secondary | ICD-10-CM

## 2021-01-02 DIAGNOSIS — Z20822 Contact with and (suspected) exposure to covid-19: Secondary | ICD-10-CM | POA: Diagnosis not present

## 2021-01-02 DIAGNOSIS — Z79899 Other long term (current) drug therapy: Secondary | ICD-10-CM | POA: Diagnosis not present

## 2021-01-02 DIAGNOSIS — I10 Essential (primary) hypertension: Principal | ICD-10-CM | POA: Insufficient documentation

## 2021-01-02 DIAGNOSIS — J45909 Unspecified asthma, uncomplicated: Secondary | ICD-10-CM | POA: Diagnosis not present

## 2021-01-02 DIAGNOSIS — M7989 Other specified soft tissue disorders: Secondary | ICD-10-CM | POA: Diagnosis not present

## 2021-01-02 DIAGNOSIS — R0602 Shortness of breath: Secondary | ICD-10-CM | POA: Diagnosis not present

## 2021-01-02 DIAGNOSIS — I160 Hypertensive urgency: Secondary | ICD-10-CM | POA: Diagnosis present

## 2021-01-02 DIAGNOSIS — R0789 Other chest pain: Secondary | ICD-10-CM | POA: Diagnosis present

## 2021-01-02 LAB — CBC
HCT: 43.5 % (ref 36.0–46.0)
Hemoglobin: 14.4 g/dL (ref 12.0–15.0)
MCH: 30.2 pg (ref 26.0–34.0)
MCHC: 33.1 g/dL (ref 30.0–36.0)
MCV: 91.2 fL (ref 80.0–100.0)
Platelets: 271 10*3/uL (ref 150–400)
RBC: 4.77 MIL/uL (ref 3.87–5.11)
RDW: 12.9 % (ref 11.5–15.5)
WBC: 11.6 10*3/uL — ABNORMAL HIGH (ref 4.0–10.5)
nRBC: 0 % (ref 0.0–0.2)

## 2021-01-02 LAB — BASIC METABOLIC PANEL
Anion gap: 8 (ref 5–15)
BUN: 21 mg/dL — ABNORMAL HIGH (ref 6–20)
CO2: 23 mmol/L (ref 22–32)
Calcium: 9 mg/dL (ref 8.9–10.3)
Chloride: 105 mmol/L (ref 98–111)
Creatinine, Ser: 0.95 mg/dL (ref 0.44–1.00)
GFR, Estimated: >60 mL/min (ref >60)
Glucose, Bld: 98 mg/dL (ref 70–99)
Potassium: 3.6 mmol/L (ref 3.5–5.1)
Sodium: 136 mmol/L (ref 135–145)

## 2021-01-02 LAB — I-STAT BETA HCG BLOOD, ED (MC, WL, AP ONLY): I-stat hCG, quantitative: <5 m[IU]/mL (ref <5)

## 2021-01-02 LAB — TROPONIN I (HIGH SENSITIVITY)
Troponin I (High Sensitivity): 21 ng/L — ABNORMAL HIGH (ref <18)
Troponin I (High Sensitivity): 17 ng/L (ref <18)

## 2021-01-02 LAB — CBG MONITORING, ED: Glucose-Capillary: 99 mg/dL (ref 70–99)

## 2021-01-02 MED ORDER — ASPIRIN 81 MG PO CHEW
324.0000 mg | CHEWABLE_TABLET | Freq: Once | ORAL | Status: AC
  Administered 2021-01-02: 324 mg via ORAL
  Filled 2021-01-02: qty 4

## 2021-01-02 MED ORDER — IOHEXOL 350 MG/ML SOLN
100.0000 mL | Freq: Once | INTRAVENOUS | Status: AC | PRN
  Administered 2021-01-02: 100 mL via INTRAVENOUS

## 2021-01-02 MED ORDER — LABETALOL HCL 5 MG/ML IV SOLN
10.0000 mg | Freq: Once | INTRAVENOUS | Status: AC
  Administered 2021-01-02: 10 mg via INTRAVENOUS
  Filled 2021-01-02: qty 4

## 2021-01-02 NOTE — ED Provider Notes (Signed)
Emergency Medicine Provider Triage Evaluation Note  Bianca Hernandez , a 55 y.o. female  was evaluated in triage.  Pt complains of chest discomfort, shortness of breath and leg swelling Pt reports she has not been taking her high blood pressure medications  Review of Systems  Positive: weakness  Negative: no fever     Physical Exam  BP (!) 192/123 (BP Location: Left Arm)   Pulse 94   Temp 99.9 F (37.7 C) (Oral)   Resp 18   Ht 5\' 6"  (1.676 m)   Wt 96.4 kg   SpO2 98%   BMI 34.31 kg/m  Gen:   Awake, no distress   Resp:  Normal effort  MSK:   Moves extremities without difficulty  Other:    Medical Decision Making  Medically screening exam initiated at 2:27 PM.  Appropriate orders placed.  Samora Jernberg was informed that the remainder of the evaluation will be completed by another provider, this initial triage assessment does not replace that evaluation, and the importance of remaining in the ED until their evaluation is complete.     Candyce Churn, Elson Areas 01/02/21 1429    14/02/22, MD 01/02/21 [phone]

## 2021-01-02 NOTE — ED Provider Notes (Signed)
[facility]-EMERGENCY DEPT Provider Note   CSN: [id number] Arrival date & time: 01/02/21  1332     History Chief Complaint  Patient presents with   Chest Pain   Hypertension   Leg Swelling    Bianca Hernandez is a 55 y.o. female.  The history is provided by the patient and medical records. No language interpreter was used.  Chest Pain Pain location:  L chest Pain quality: aching and pressure   Pain radiates to:  L arm Pain severity:  Moderate Onset quality:  Gradual Duration:  3 days Timing:  Intermittent Progression:  Waxing and waning Chronicity:  New Relieved by:  Nothing Worsened by:  Nothing Ineffective treatments:  None tried Associated symptoms: fatigue, lower extremity edema and shortness of breath   Associated symptoms: no abdominal pain, no back pain, no cough, no dizziness, no fever, no headache, no nausea, no near-syncope, no palpitations, no syncope, no vomiting and no weakness   Risk factors: hypertension   Hypertension Associated symptoms include chest pain and shortness of breath. Pertinent negatives include no abdominal pain and no headaches.      Past Medical History:  Diagnosis Date   Asthma    Hypertension     Patient Active Problem List   Diagnosis Date Noted   Essential hypertension 10/03/2019   Pneumonia due to COVID-19 virus 10/03/2019   Cough variant asthma 10/03/2019    Past Surgical History:  Procedure Laterality Date   CESAREAN SECTION     OVARIAN CYST REMOVAL       OB History   No obstetric history on file.     Family History  Problem Relation Age of Onset   Diabetes Mother    Hypertension Mother    Diabetes Father    Hypertension Father     Social History   Tobacco Use   Smoking status: Never   Smokeless tobacco: Never  Vaping Use   Vaping Use: Never used  Substance Use Topics   Alcohol use: Yes   Drug use: Never    Home Medications Prior to Admission medications   Medication Sig Start  Date End Date Taking? Authorizing Provider  albuterol (VENTOLIN HFA) 108 (90 Base) MCG/ACT inhaler INHALE 1-2 PUFFS BY MOUTH EVERY 6 HOURS AS NEEDED FOR WHEEZE OR SHORTNESS OF BREATH 04/21/20   Nyoka Cowden, MD  budesonide-formoterol (SYMBICORT) 80-4.5 MCG/ACT inhaler Inhale 2 puffs into the lungs 2 (two) times daily. in the morning and at bedtime. 08/26/20   Nyoka Cowden, MD  valsartan (DIOVAN) 80 MG tablet Take 1 tablet (80 mg total) by mouth daily. 10/03/19   Nyoka Cowden, MD    Allergies    Patient has no known allergies.  Review of Systems   Review of Systems  Constitutional:  Positive for fatigue. Negative for chills and fever.  HENT:  Negative for congestion.   Eyes:  Negative for visual disturbance.  Respiratory:  Positive for chest tightness and shortness of breath. Negative for cough and wheezing.   Cardiovascular:  Positive for chest pain and leg swelling. Negative for palpitations, syncope and near-syncope.  Gastrointestinal:  Negative for abdominal pain, constipation, diarrhea, nausea and vomiting.  Genitourinary:  Negative for dysuria.  Musculoskeletal:  Negative for back pain, neck pain and neck stiffness.  Skin:  Negative for rash and wound.  Neurological:  Negative for dizziness, weakness, light-headedness and headaches.  Psychiatric/Behavioral:  Negative for agitation and confusion.   All other systems reviewed and are negative.  Physical  Exam Updated Vital Signs BP (!) 177/103 (BP Location: Left Arm) Comment: RN, Vladimir Creeks made aware of pt. elevated bp.  Pulse 75   Temp 99.3 F (37.4 C) (Oral)   Resp 15   Ht 5\' 6"  (1.676 m)   Wt 96.4 kg   SpO2 99%   BMI 34.31 kg/m   Physical Exam Vitals and nursing note reviewed.  Constitutional:      General: She is not in acute distress.    Appearance: She is well-developed. She is not ill-appearing, toxic-appearing or diaphoretic.  HENT:     Head: Normocephalic and atraumatic.  Eyes:     Conjunctiva/sclera:  Conjunctivae normal.  Cardiovascular:     Rate and Rhythm: Normal rate and regular rhythm.     Heart sounds: No murmur heard. Pulmonary:     Effort: Pulmonary effort is normal. No respiratory distress.     Breath sounds: Normal breath sounds. No decreased breath sounds, wheezing, rhonchi or rales.  Chest:     Chest wall: No tenderness.  Abdominal:     Palpations: Abdomen is soft.     Tenderness: There is no abdominal tenderness.  Musculoskeletal:        General: No swelling.     Cervical back: Neck supple.     Right lower leg: No tenderness. Edema present.     Left lower leg: Tenderness present. Edema present.  Skin:    General: Skin is warm and dry.     Capillary Refill: Capillary refill takes less than 2 seconds.     Findings: No erythema.  Neurological:     General: No focal deficit present.     Mental Status: She is alert.  Psychiatric:        Mood and Affect: Mood is anxious.    ED Results / Procedures / Treatments   Labs (all labs ordered are listed, but only abnormal results are displayed) Labs Reviewed  BASIC METABOLIC PANEL - Abnormal; Notable for the following components:      Result Value   BUN 21 (*)    All other components within normal limits  CBC - Abnormal; Notable for the following components:   WBC 11.6 (*)    All other components within normal limits  HEPATIC FUNCTION PANEL - Abnormal; Notable for the following components:   Total Protein 6.2 (*)    All other components within normal limits  TROPONIN I (HIGH SENSITIVITY) - Abnormal; Notable for the following components:   Troponin I (High Sensitivity) 21 (*)    All other components within normal limits  RESP PANEL BY RT-PCR (FLU A&B, COVID) ARPGX2  BRAIN NATRIURETIC PEPTIDE  CBG MONITORING, ED  I-STAT BETA HCG BLOOD, ED ([facility], [facility], [facility] ONLY)  TROPONIN I (HIGH SENSITIVITY)    EKG EKG Interpretation  Date/Time:  Friday January 02 2021 23:22:52 EST Ventricular Rate:  82 PR Interval:  178 QRS  Duration: 93 QT Interval:  399 QTC Calculation: 466 R Axis:   44 Text Interpretation: Sinus rhythm When compared to prior, similar appearance. NO STEMI Confirmed by Antony Blackbird [phone]) on 01/02/2021 11:40:56 PM  Radiology DG Chest Portable 1 View  Result Date: 01/02/2021 CLINICAL DATA:  Shortness of breath. EXAM: PORTABLE CHEST 1 VIEW COMPARISON:  Chest radiograph 10/03/2019 FINDINGS: The heart size and mediastinal contours are within normal limits. Both lungs are clear. The visualized skeletal structures are unremarkable. IMPRESSION: No acute cardiopulmonary abnormality. Electronically Signed   By: Ileana Roup M.D.   On: 01/02/2021 14:50  CT Angio Chest/Abd/Pel for Dissection W and/or Wo Contrast  Result Date: 01/02/2021 CLINICAL DATA:  Chest and back pain with hypertension, initial encounter EXAM: CT ANGIOGRAPHY CHEST, ABDOMEN AND PELVIS TECHNIQUE: Non-contrast CT of the chest was initially obtained. Multidetector CT imaging through the chest, abdomen and pelvis was performed using the standard protocol during bolus administration of intravenous contrast. Multiplanar reconstructed images and MIPs were obtained and reviewed to evaluate the vascular anatomy. CONTRAST:  161mL OMNIPAQUE IOHEXOL 350 MG/ML SOLN COMPARISON:  Chest x-ray from earlier in the same day. FINDINGS: CTA CHEST FINDINGS Cardiovascular: Initial precontrast images demonstrate no hyperdense crescent to suggest acute aortic injury. Post-contrast images demonstrate the thoracic aorta and its branches to be within normal limits. No aneurysmal dilatation or dissection is noted. No cardiac enlargement is seen. Pulmonary artery as visualized is within normal limits. Mediastinum/Nodes: Thoracic inlet is within normal limits. No sizable hilar or mediastinal adenopathy is noted. The esophagus as visualized is within normal limits. Lungs/Pleura: Lungs are well aerated bilaterally. No focal infiltrate or sizable effusion is noted. A small left  lower lobe nodule is noted measuring 4 mm. Musculoskeletal: No acute rib abnormality is noted. Degenerative changes of the thoracic spine are seen. Review of the MIP images confirms the above findings. CTA ABDOMEN AND PELVIS FINDINGS VASCULAR Aorta: Normal caliber aorta without aneurysm, dissection, vasculitis or significant stenosis. Celiac: Patent without evidence of aneurysm, dissection, vasculitis or significant stenosis. SMA: Patent without evidence of aneurysm, dissection, vasculitis or significant stenosis. Renals: Dual renal arteries are noted on the right with 3 renal arteries on the left. No focal stenosis is noted. IMA: Patent without evidence of aneurysm, dissection, vasculitis or significant stenosis. Inflow: Iliacs are within normal limits bilaterally. Veins: No specific venous abnormality is noted. Review of the MIP images confirms the above findings. NON-VASCULAR Hepatobiliary: Fatty infiltration of the liver is noted. Gallbladder is within normal limits. Pancreas: Unremarkable. No pancreatic ductal dilatation or surrounding inflammatory changes. Spleen: Normal in size without focal abnormality. Adrenals/Urinary Tract: Adrenal glands are within normal limits. Kidneys demonstrate a normal enhancement pattern bilaterally. Large left renal cyst is noted simple in nature measuring 4.9 cm. No obstructive changes are seen. The bladder is decompressed. Stomach/Bowel: The appendix is within normal limits. No obstructive or inflammatory changes of the colon are seen. Small bowel and stomach appear within normal limits. Lymphatic: No sizable lymphadenopathy is noted. Reproductive: Uterus and bilateral adnexa are unremarkable. Other: No abdominal wall hernia or abnormality. No abdominopelvic ascites. Musculoskeletal: No acute or significant osseous findings. Review of the MIP images confirms the above findings. IMPRESSION: No evidence of aortic dissection or aneurysmal dilatation. No evidence of pulmonary  emboli. 4 mm left solid pulmonary nodule. No routine follow-up imaging is recommended per Fleischner Society Guidelines. These guidelines do not apply to immunocompromised patients and patients with cancer. Follow up in patients with significant comorbidities as clinically warranted. For lung cancer screening, adhere to Lung-RADS guidelines. Reference: Radiology. 2017; 284(1):228-43. No acute abnormality in the abdomen is noted. Electronically Signed   By: Inez Catalina M.D.   On: 01/02/2021 23:33    Procedures Procedures   Medications Ordered in ED Medications  labetalol (NORMODYNE) injection 10 mg (has no administration in time range)  aspirin chewable tablet 324 mg (324 mg Oral Given 01/02/21 1434)  labetalol (NORMODYNE) injection 10 mg (10 mg Intravenous Given 01/02/21 2322)  iohexol (OMNIPAQUE) 350 MG/ML injection 100 mL (100 mLs Intravenous Contrast Given 01/02/21 2310)    ED Course  I have  reviewed the triage vital signs and the nursing notes.  Pertinent labs & imaging results that were available during my care of the patient were reviewed by me and considered in my medical decision making (see chart for details).    MDM Rules/Calculators/A&P                           Bianca Hernandez is a 55 y.o. female with a past medical history significant for hypertension and asthma who presents with elevated blood pressures and chest pain/shortness of breath.  Patient reports that for the last few days she has had pain in her left chest that goes down her left arm.  She also reports it is exertional and pleuritic.  She reports it has been going on for the last few days and until yesterday she had not been taking her blood pressure medicine for several weeks.  She says that her legs been swollen bilaterally but left worse than right with some pain in her ankle.  She denies any trauma.  She denies fevers, chills, congestion, or cough.  She denies any abdominal pain.  No nausea, vomiting, or diaphoresis.   Patient says that she was concerned about the blood clot given the chest pain, shortness of breath, leg swelling but denies any history of this.  On exam, lungs were clear and chest was nontender.  No murmur.  Good pulses in extremities.  Patient had slightly more swelling in left leg compared to right but otherwise was neurovascularly intact.  Normal bowel sounds.  No focal neurologic deficits.  On my initial evaluation, blood pressure was 254 systolic and she was actively having chest discomfort.  Had a shared decision made conversation with patient.  Given the significant elevated blood pressure over 250, the chest pain going down her left arm, we did agree that patient needed medication to help her blood pressure.  She was given labetalol and started to improve slightly into the 180s.  Patient also agreed to get imaging.  Clinically I was more concerned about a PE however given the elevated blood pressure, feel is appropriate to get a dissection study that would also look for large PE.  We will get troponins trended as initial and was just elevated at 21.  Clinically I do suspect she is having hypertensive emergency causing the chest pain and shortness of breath.     12:37 AM Patient's second troponin was improved from prior.  Patient was still having some chest discomfort and blood pressure was starting to improve down from 254 down to 185.  Work-up continue to return.  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  We had a discussion about disposition options.  As she is new to the area and does not have a PCP and is still having some chest discomfort with elevated blood pressures despite home medication, we do feel is reasonable to admit for hypertensive emergency/urgency causing exertional chest pain going on her left arm with elevated blood pressures.  Anticipate she will need blood pressure medication  titration for her home meds and will need to get established with a PCP.  Will give another dose of labetalol given the elevated blood pressure still and the continued discomfort.   Final Clinical Impression(s) / ED Diagnoses Final diagnoses:  Chest pain, unspecified type  Hypertension, unspecified type     Clinical Impression:  1. Chest pain, unspecified type   2. Hypertension, unspecified type     Disposition: Admit This note was prepared with assistance of Dragon voice recognition software. Occasional wrong-word or sound-a-like substitutions may have occurred due to the inherent limitations of voice recognition software.     Miki Labuda, Gwenyth Allegra, MD 01/03/21 [phone]

## 2021-01-02 NOTE — ED Triage Notes (Signed)
Patient c/o left chest pain x a few days. Patient also c/o hypertension and states she has not been taking her BP meds accurately for the past 5-6 weeks. Patient also c/o bilateral leg swelling L>R.

## 2021-01-03 ENCOUNTER — Encounter (HOSPITAL_COMMUNITY): Payer: Self-pay | Admitting: Family Medicine

## 2021-01-03 DIAGNOSIS — I160 Hypertensive urgency: Secondary | ICD-10-CM

## 2021-01-03 DIAGNOSIS — I10 Essential (primary) hypertension: Secondary | ICD-10-CM | POA: Diagnosis not present

## 2021-01-03 LAB — HEPATIC FUNCTION PANEL
ALT: 36 U/L (ref 0–44)
AST: 22 U/L (ref 15–41)
Albumin: 3.7 g/dL (ref 3.5–5.0)
Alkaline Phosphatase: 66 U/L (ref 38–126)
Bilirubin, Direct: 0.1 mg/dL (ref 0.0–0.2)
Indirect Bilirubin: 0.7 mg/dL (ref 0.3–0.9)
Total Bilirubin: 0.8 mg/dL (ref 0.3–1.2)
Total Protein: 6.2 g/dL — ABNORMAL LOW (ref 6.5–8.1)

## 2021-01-03 LAB — RESP PANEL BY RT-PCR (FLU A&B, COVID) ARPGX2
Influenza A by PCR: NEGATIVE
Influenza B by PCR: NEGATIVE
SARS Coronavirus 2 by RT PCR: NEGATIVE

## 2021-01-03 LAB — BRAIN NATRIURETIC PEPTIDE: B Natriuretic Peptide: 14.6 pg/mL (ref 0.0–100.0)

## 2021-01-03 MED ORDER — HYDROCODONE-ACETAMINOPHEN 5-325 MG PO TABS: 1.0000 | ORAL_TABLET | Freq: Four times a day (QID) | ORAL | Status: DC | PRN

## 2021-01-03 MED ORDER — HYDROCHLOROTHIAZIDE 12.5 MG PO TABS: 12.5000 mg | ORAL_TABLET | Freq: Every day | ORAL | Status: DC

## 2021-01-03 MED ORDER — ACETAMINOPHEN 325 MG PO TABS: 650.0000 mg | ORAL_TABLET | Freq: Four times a day (QID) | ORAL | Status: DC | PRN

## 2021-01-03 MED ORDER — ACETAMINOPHEN 650 MG RE SUPP: 650.0000 mg | Freq: Four times a day (QID) | RECTAL | Status: DC | PRN

## 2021-01-03 MED ORDER — MOMETASONE FURO-FORMOTEROL FUM 100-5 MCG/ACT IN AERO: 2.0000 | INHALATION_SPRAY | Freq: Two times a day (BID) | RESPIRATORY_TRACT | Status: DC

## 2021-01-03 MED ORDER — LABETALOL HCL 5 MG/ML IV SOLN
10.0000 mg | Freq: Once | INTRAVENOUS | Status: AC
  Administered 2021-01-03: 10 mg via INTRAVENOUS
  Filled 2021-01-03: qty 4

## 2021-01-03 MED ORDER — VALSARTAN 80 MG PO TABS: 80.0000 mg | ORAL_TABLET | Freq: Every day | ORAL | 11 refills | Status: AC

## 2021-01-03 MED ORDER — IRBESARTAN 150 MG PO TABS
150.0000 mg | ORAL_TABLET | Freq: Every day | ORAL | Status: DC
  Administered 2021-01-03: 150 mg via ORAL
  Filled 2021-01-03: qty 1

## 2021-01-03 MED ORDER — HYDROCHLOROTHIAZIDE 25 MG PO TABS: 25.0000 mg | ORAL_TABLET | Freq: Every day | ORAL | 0 refills | Status: AC

## 2021-01-03 MED ORDER — HYDRALAZINE HCL 25 MG PO TABS: 25.0000 mg | ORAL_TABLET | Freq: Four times a day (QID) | ORAL | Status: DC | PRN

## 2021-01-03 MED ORDER — ALBUTEROL SULFATE HFA 108 (90 BASE) MCG/ACT IN AERS: 2.0000 | INHALATION_SPRAY | RESPIRATORY_TRACT | Status: DC | PRN

## 2021-01-03 MED ORDER — SENNOSIDES-DOCUSATE SODIUM 8.6-50 MG PO TABS: 1.0000 | ORAL_TABLET | Freq: Every evening | ORAL | Status: DC | PRN

## 2021-01-03 MED ORDER — ENOXAPARIN SODIUM 40 MG/0.4ML IJ SOSY: 40.0000 mg | PREFILLED_SYRINGE | INTRAMUSCULAR | Status: DC

## 2021-01-03 MED ORDER — SODIUM CHLORIDE 0.9% FLUSH: 3.0000 mL | Freq: Two times a day (BID) | INTRAVENOUS | Status: DC

## 2021-01-03 MED ORDER — LABETALOL HCL 5 MG/ML IV SOLN
20.0000 mg | Freq: Once | INTRAVENOUS | Status: AC
  Administered 2021-01-03: 20 mg via INTRAVENOUS
  Filled 2021-01-03: qty 4

## 2021-01-03 MED ORDER — HYDROCHLOROTHIAZIDE 25 MG PO TABS
25.0000 mg | ORAL_TABLET | Freq: Every day | ORAL | Status: DC
  Administered 2021-01-03: 25 mg via ORAL
  Filled 2021-01-03: qty 1

## 2021-01-03 MED ORDER — ONDANSETRON HCL 4 MG PO TABS: 4.0000 mg | ORAL_TABLET | Freq: Four times a day (QID) | ORAL | Status: DC | PRN

## 2021-01-03 MED ORDER — ONDANSETRON HCL 4 MG/2ML IJ SOLN: 4.0000 mg | Freq: Four times a day (QID) | INTRAMUSCULAR | Status: DC | PRN

## 2021-01-03 NOTE — H&P (Signed)
History and Physical    Bianca Hernandez FBP:[id number] DOB: [birth year] DOA: 01/02/2021  PCP: Patient, No Pcp Per (Inactive)   Patient coming from: Home   Chief Complaint: Leg swelling, high BP   HPI: Bianca Hernandez is a pleasant 55 y.o. female with medical history significant for asthma and hypertension, now presenting to the emergency department from urgent care for evaluation of elevated blood pressure.  Patient states that she has noticed increasing bilateral lower extremity swelling and erythema involving the bilateral lower legs recently and sought evaluation at an urgent care yesterday for this.  She has had similar issues for years and was previously using compression stockings but did not feel as though they have were working.  She was noted to have severely elevated blood pressure at the urgent care and was directed to the ED for further evaluation of this.  Patient reports that she only takes her blood pressure medications sporadically.  She denies headache, change in vision or hearing, or focal numbness or weakness.  She denies any chest pain at this time but has had some occasional discomfort in her breast for months, prompting her to have a mammogram in September which she says was reassuring.  She has a blood pressure cuff at home but has not used it recently.  She does not have a primary care physician currently.  ED Course: Upon arrival to the ED, patient is found to be afebrile, saturating well on room air, and hypertensive to 192/123.  EKG features sinus rhythm.  Chest x-ray negative for acute cardiopulmonary disease.  Troponin was 21 and then 17.  BNP 15.  CTA chest/abdomen/pelvis is negative for aortic dissection or aneurysm, negative for PE, and notable for 4 mm left pleural nodule.  Patient was given 3 doses of IV labetalol in the ED.  Review of Systems:  All other systems reviewed and apart from HPI, are negative.  Past Medical History:  Diagnosis Date   Asthma    Hypertension      Past Surgical History:  Procedure Laterality Date   CESAREAN SECTION     OVARIAN CYST REMOVAL      Social History:   reports that she has never smoked. She has never used smokeless tobacco. She reports current alcohol use. She reports that she does not use drugs.  No Known Allergies  Family History  Problem Relation Age of Onset   Diabetes Mother    Hypertension Mother    Diabetes Father    Hypertension Father      Prior to Admission medications   Medication Sig Start Date End Date Taking? Authorizing Provider  albuterol (VENTOLIN HFA) 108 (90 Base) MCG/ACT inhaler INHALE 1-2 PUFFS BY MOUTH EVERY 6 HOURS AS NEEDED FOR WHEEZE OR SHORTNESS OF BREATH 04/21/20   Nyoka Cowden, MD  budesonide-formoterol (SYMBICORT) 80-4.5 MCG/ACT inhaler Inhale 2 puffs into the lungs 2 (two) times daily. in the morning and at bedtime. 08/26/20   Nyoka Cowden, MD  valsartan (DIOVAN) 80 MG tablet Take 1 tablet (80 mg total) by mouth daily. 10/03/19   Nyoka Cowden, MD    Physical Exam: Vitals:   01/03/21 0200 01/03/21 0230 01/03/21 0315 01/03/21 0415  BP: (!) 157/82 (!) 166/99 (!) 143/69 (!) 199/108  Pulse: 66 66 70 74  Resp: 17 16 16  (!) 23  Temp:      TempSrc:      SpO2: 94% 93% 93% 91%  Weight:      Height:  Constitutional: NAD, calm  Eyes: PERTLA, lids and conjunctivae normal ENMT: Mucous membranes are moist. Posterior pharynx clear of any exudate or lesions.   Neck: supple, no masses  Respiratory: no wheezing, no crackles. No accessory muscle use.  Cardiovascular: S1 & S2 heard, regular rate and rhythm. Mild lower leg edema bilaterally.  Abdomen: No distension, no tenderness, soft. Bowel sounds active.  Musculoskeletal: no clubbing / cyanosis. No joint deformity upper and lower extremities.   Skin: Faint erythema involving lower legs bilaterally. Warm, dry, well-perfused. Neurologic: CN 2-12 grossly intact. Moving all extremities. Alert and oriented.  Psychiatric:  Pleasant. Cooperative.    Labs and Imaging on Admission: I have personally reviewed following labs and imaging studies  CBC: Recent Labs  Lab 01/02/21 1451  WBC 11.6*  HGB 14.4  HCT 43.5  MCV 91.2  PLT 99991111   Basic Metabolic Panel: Recent Labs  Lab 01/02/21 1451  NA 136  K 3.6  CL 105  CO2 23  GLUCOSE 98  BUN 21*  CREATININE 0.95  CALCIUM 9.0   GFR: Estimated Creatinine Clearance: 80.1 mL/min (by C-G formula based on SCr of 0.95 mg/dL). Liver Function Tests: Recent Labs  Lab 01/02/21 2327  AST 22  ALT 36  ALKPHOS 66  BILITOT 0.8  PROT 6.2*  ALBUMIN 3.7   No results for input(s): LIPASE, AMYLASE in the last 168 hours. No results for input(s): AMMONIA in the last 168 hours. Coagulation Profile: No results for input(s): INR, PROTIME in the last 168 hours. Cardiac Enzymes: No results for input(s): CKTOTAL, CKMB, CKMBINDEX, TROPONINI in the last 168 hours. BNP (last 3 results) No results for input(s): PROBNP in the last 8760 hours. HbA1C: No results for input(s): HGBA1C in the last 72 hours. CBG: Recent Labs  Lab 01/02/21 1448  GLUCAP 99   Lipid Profile: No results for input(s): CHOL, HDL, LDLCALC, TRIG, CHOLHDL, LDLDIRECT in the last 72 hours. Thyroid Function Tests: No results for input(s): TSH, T4TOTAL, FREET4, T3FREE, THYROIDAB in the last 72 hours. Anemia Panel: No results for input(s): VITAMINB12, FOLATE, FERRITIN, TIBC, IRON, RETICCTPCT in the last 72 hours. Urine analysis: No results found for: COLORURINE, APPEARANCEUR, LABSPEC, PHURINE, GLUCOSEU, HGBUR, BILIRUBINUR, KETONESUR, PROTEINUR, UROBILINOGEN, NITRITE, LEUKOCYTESUR Sepsis Labs: @LABRCNTIP (procalcitonin:4,lacticidven:4) ) Recent Results (from the past 240 hour(s))  Resp Panel by RT-PCR (Flu A&B, Covid) Nasopharyngeal Swab     Status: None   Collection Time: 01/02/21 11:06 PM   Specimen: Nasopharyngeal Swab; Nasopharyngeal(NP) swabs in vial transport medium  Result Value Ref Range  Status   SARS Coronavirus 2 by RT PCR NEGATIVE NEGATIVE Final    Comment: (NOTE) SARS-CoV-2 target nucleic acids are NOT DETECTED.  The SARS-CoV-2 RNA is generally detectable in upper respiratory specimens during the acute phase of infection. The lowest concentration of SARS-CoV-2 viral copies this assay can detect is 138 copies/mL. A negative result does not preclude SARS-Cov-2 infection and should not be used as the sole basis for treatment or other patient management decisions. A negative result may occur with  improper specimen collection/handling, submission of specimen other than nasopharyngeal swab, presence of viral mutation(s) within the areas targeted by this assay, and inadequate number of viral copies(<138 copies/mL). A negative result must be combined with clinical observations, patient history, and epidemiological information. The expected result is Negative.  Fact Sheet for Patients:  EntrepreneurPulse.com.au  Fact Sheet for Healthcare Providers:  IncredibleEmployment.be  This test is no t yet approved or cleared by the [place] and  has been authorized  for detection and/or diagnosis of SARS-CoV-2 by FDA under an Emergency Use Authorization (EUA). This EUA will remain  in effect (meaning this test can be used) for the duration of the COVID-19 declaration under Section 564(b)(1) of the Act, 21 U.S.C.section 360bbb-3(b)(1), unless the authorization is terminated  or revoked sooner.       Influenza A by PCR NEGATIVE NEGATIVE Final   Influenza B by PCR NEGATIVE NEGATIVE Final    Comment: (NOTE) The Xpert Xpress SARS-CoV-2/FLU/RSV plus assay is intended as an aid in the diagnosis of influenza from Nasopharyngeal swab specimens and should not be used as a sole basis for treatment. Nasal washings and aspirates are unacceptable for Xpert Xpress SARS-CoV-2/FLU/RSV testing.  Fact Sheet for  Patients: EntrepreneurPulse.com.au  Fact Sheet for Healthcare Providers: IncredibleEmployment.be  This test is not yet approved or cleared by the [place] FDA and has been authorized for detection and/or diagnosis of SARS-CoV-2 by FDA under an Emergency Use Authorization (EUA). This EUA will remain in effect (meaning this test can be used) for the duration of the COVID-19 declaration under Section 564(b)(1) of the Act, 21 U.S.C. section 360bbb-3(b)(1), unless the authorization is terminated or revoked.  Performed at [facility], [place] [address] [place], [place] [address]      Radiological Exams on Admission: DG Chest Portable 1 View  Result Date: 01/02/2021 CLINICAL DATA:  Shortness of breath. EXAM: PORTABLE CHEST 1 VIEW COMPARISON:  Chest radiograph 10/03/2019 FINDINGS: The heart size and mediastinal contours are within normal limits. Both lungs are clear. The visualized skeletal structures are unremarkable. IMPRESSION: No acute cardiopulmonary abnormality. Electronically Signed   By: Ileana Roup M.D.   On: 01/02/2021 14:50   CT Angio Chest/Abd/Pel for Dissection W and/or Wo Contrast  Result Date: 01/02/2021 CLINICAL DATA:  Chest and back pain with hypertension, initial encounter EXAM: CT ANGIOGRAPHY CHEST, ABDOMEN AND PELVIS TECHNIQUE: Non-contrast CT of the chest was initially obtained. Multidetector CT imaging through the chest, abdomen and pelvis was performed using the standard protocol during bolus administration of intravenous contrast. Multiplanar reconstructed images and MIPs were obtained and reviewed to evaluate the vascular anatomy. CONTRAST:  125mL OMNIPAQUE IOHEXOL 350 MG/ML SOLN COMPARISON:  Chest x-ray from earlier in the same day. FINDINGS: CTA CHEST FINDINGS Cardiovascular: Initial precontrast images demonstrate no hyperdense crescent to suggest acute aortic injury. Post-contrast images demonstrate the thoracic  aorta and its branches to be within normal limits. No aneurysmal dilatation or dissection is noted. No cardiac enlargement is seen. Pulmonary artery as visualized is within normal limits. Mediastinum/Nodes: Thoracic inlet is within normal limits. No sizable hilar or mediastinal adenopathy is noted. The esophagus as visualized is within normal limits. Lungs/Pleura: Lungs are well aerated bilaterally. No focal infiltrate or sizable effusion is noted. A small left lower lobe nodule is noted measuring 4 mm. Musculoskeletal: No acute rib abnormality is noted. Degenerative changes of the thoracic spine are seen. Review of the MIP images confirms the above findings. CTA ABDOMEN AND PELVIS FINDINGS VASCULAR Aorta: Normal caliber aorta without aneurysm, dissection, vasculitis or significant stenosis. Celiac: Patent without evidence of aneurysm, dissection, vasculitis or significant stenosis. SMA: Patent without evidence of aneurysm, dissection, vasculitis or significant stenosis. Renals: Dual renal arteries are noted on the right with 3 renal arteries on the left. No focal stenosis is noted. IMA: Patent without evidence of aneurysm, dissection, vasculitis or significant stenosis. Inflow: Iliacs are within normal limits bilaterally. Veins: No specific venous abnormality is noted. Review of the MIP images  confirms the above findings. NON-VASCULAR Hepatobiliary: Fatty infiltration of the liver is noted. Gallbladder is within normal limits. Pancreas: Unremarkable. No pancreatic ductal dilatation or surrounding inflammatory changes. Spleen: Normal in size without focal abnormality. Adrenals/Urinary Tract: Adrenal glands are within normal limits. Kidneys demonstrate a normal enhancement pattern bilaterally. Large left renal cyst is noted simple in nature measuring 4.9 cm. No obstructive changes are seen. The bladder is decompressed. Stomach/Bowel: The appendix is within normal limits. No obstructive or inflammatory changes of the  colon are seen. Small bowel and stomach appear within normal limits. Lymphatic: No sizable lymphadenopathy is noted. Reproductive: Uterus and bilateral adnexa are unremarkable. Other: No abdominal wall hernia or abnormality. No abdominopelvic ascites. Musculoskeletal: No acute or significant osseous findings. Review of the MIP images confirms the above findings. IMPRESSION: No evidence of aortic dissection or aneurysmal dilatation. No evidence of pulmonary emboli. 4 mm left solid pulmonary nodule. No routine follow-up imaging is recommended per Fleischner Society Guidelines. These guidelines do not apply to immunocompromised patients and patients with cancer. Follow up in patients with significant comorbidities as clinically warranted. For lung cancer screening, adhere to Lung-RADS guidelines. Reference: Radiology. 2017; 284(1):228-43. No acute abnormality in the abdomen is noted. Electronically Signed   By: Inez Catalina M.D.   On: 01/02/2021 23:33    EKG: Independently reviewed. Sinus rhythm.   Assessment/Plan  1. Hypertensive urgency  - Pt with hx of HTN and medication non-adherence was seen at Urgent Care for leg swelling and redness, had BP 231/139, and was sent to ED  - Negative CTA chest/abd/pelvis in ED and no evidence for end-organ damage  - She was given 3 doses IV labetalol in ED  - She has not been taking valsartan but will likely need more than monotherapy  - Resume valsartan, add long-acting dihydropyridine or thiazide, continue to treat as needed for SBP >180, DBP >100, or symptoms   2. Asthma  - No cough or wheezing on admission  - Continue ICS/LABA and as-needed SABA   3. Leg swelling  - BNP and albumin normal in ED; appears to have mild stasis dermatitis   - Leg elevation, compression stockings, skin care    DVT prophylaxis: Lovenox  Code Status: Full  Level of Care: Level of care: Telemetry Family Communication: Daughter updated at bedside  Disposition Plan:  Patient is  from: Home  Anticipated d/c is to: Home  Anticipated d/c date is: 01/04/21  Patient currently: Pending BP control  Consults called: none   Admission status: observation     Vianne Bulls, MD [facility]  01/03/2021, 6:14 AM

## 2021-01-07 NOTE — Discharge Summary (Signed)
Physician Discharge Summary   Patient name: Bianca Hernandez  Admit date:     01/02/2021  Discharge date: 01/03/2021   Discharge Physician: Berle Mull   PCP: Patient, No Pcp Per (Inactive)   Recommendations at discharge: follow up with PCP in 1 week remain compliant with medicine.   Discharge Diagnoses Principal Problem:   Hypertensive urgency Active Problems:   Cough variant asthma   Hospital Course     1. Hypertensive urgency  Pt with hx of HTN and medication non-adherence was seen at Urgent Care for leg swelling and redness, had BP 231/139, and was sent to ED   Negative CTA chest/abd/pelvis in ED and no evidence for end-organ damage  She has not been taking valsartan - Resume valsartan, add long-acting dihydropyridine or thiazide,    2. Asthma  No cough or wheezing on admission  Continue ICS/LABA and as-needed SABA    3. Leg swelling  BNP and albumin normal in ED; appears to have mild stasis dermatitis   Leg elevation, compression stockings, skin care   4. Obesity  Placing the pt at higher risk Body mass index is 34.31 kg/m.   Procedures performed: none   Condition at discharge: good  Exam General: Appear in mild distress, no Rash; Oral Mucosa Clear, moist. no Abnormal Neck Mass Or lumps, Conjunctiva normal  Cardiovascular: S1 and S2 Present, no Murmur, Respiratory: good respiratory effort, Bilateral Air entry present and CTA, no Crackles, no wheezes Abdomen: Bowel Sound present, Soft and no tenderness Extremities: no Pedal edema Neurology: alert and oriented to time, place, and person affect appropriate. no new focal deficit  Disposition: Home  Discharge time: greater than 30 minutes.  Follow-up Information     PCP. Schedule an appointment as soon as possible for a visit in 1 week(s).   Why: establish care with a PCP and follow up with them in 1 week. need to recheck BMP.                Allergies as of 01/03/2021   No Known Allergies       Medication List     TAKE these medications    albuterol 108 (90 Base) MCG/ACT inhaler Commonly known as: VENTOLIN HFA INHALE 1-2 PUFFS BY MOUTH EVERY 6 HOURS AS NEEDED FOR WHEEZE OR SHORTNESS OF BREATH   budesonide-formoterol 80-4.5 MCG/ACT inhaler Commonly known as: Symbicort Inhale 2 puffs into the lungs 2 (two) times daily. in the morning and at bedtime.   hydrochlorothiazide 25 MG tablet Commonly known as: HYDRODIURIL Take 1 tablet (25 mg total) by mouth daily.   valsartan 80 MG tablet Commonly known as: Diovan Take 1 tablet (80 mg total) by mouth daily.        Filed Weights   01/02/21 1341  Weight: 96.4 kg    DG Chest Portable 1 View  Result Date: 01/02/2021 CLINICAL DATA:  Shortness of breath. EXAM: PORTABLE CHEST 1 VIEW COMPARISON:  Chest radiograph 10/03/2019 FINDINGS: The heart size and mediastinal contours are within normal limits. Both lungs are clear. The visualized skeletal structures are unremarkable. IMPRESSION: No acute cardiopulmonary abnormality. Electronically Signed   By: Ileana Roup M.D.   On: 01/02/2021 14:50   CT Angio Chest/Abd/Pel for Dissection W and/or Wo Contrast  Result Date: 01/02/2021 CLINICAL DATA:  Chest and back pain with hypertension, initial encounter EXAM: CT ANGIOGRAPHY CHEST, ABDOMEN AND PELVIS TECHNIQUE: Non-contrast CT of the chest was initially obtained. Multidetector CT imaging through the chest, abdomen and pelvis was performed using  the standard protocol during bolus administration of intravenous contrast. Multiplanar reconstructed images and MIPs were obtained and reviewed to evaluate the vascular anatomy. CONTRAST:  151mL OMNIPAQUE IOHEXOL 350 MG/ML SOLN COMPARISON:  Chest x-ray from earlier in the same day. FINDINGS: CTA CHEST FINDINGS Cardiovascular: Initial precontrast images demonstrate no hyperdense crescent to suggest acute aortic injury. Post-contrast images demonstrate the thoracic aorta and its branches to be within normal  limits. No aneurysmal dilatation or dissection is noted. No cardiac enlargement is seen. Pulmonary artery as visualized is within normal limits. Mediastinum/Nodes: Thoracic inlet is within normal limits. No sizable hilar or mediastinal adenopathy is noted. The esophagus as visualized is within normal limits. Lungs/Pleura: Lungs are well aerated bilaterally. No focal infiltrate or sizable effusion is noted. A small left lower lobe nodule is noted measuring 4 mm. Musculoskeletal: No acute rib abnormality is noted. Degenerative changes of the thoracic spine are seen. Review of the MIP images confirms the above findings. CTA ABDOMEN AND PELVIS FINDINGS VASCULAR Aorta: Normal caliber aorta without aneurysm, dissection, vasculitis or significant stenosis. Celiac: Patent without evidence of aneurysm, dissection, vasculitis or significant stenosis. SMA: Patent without evidence of aneurysm, dissection, vasculitis or significant stenosis. Renals: Dual renal arteries are noted on the right with 3 renal arteries on the left. No focal stenosis is noted. IMA: Patent without evidence of aneurysm, dissection, vasculitis or significant stenosis. Inflow: Iliacs are within normal limits bilaterally. Veins: No specific venous abnormality is noted. Review of the MIP images confirms the above findings. NON-VASCULAR Hepatobiliary: Fatty infiltration of the liver is noted. Gallbladder is within normal limits. Pancreas: Unremarkable. No pancreatic ductal dilatation or surrounding inflammatory changes. Spleen: Normal in size without focal abnormality. Adrenals/Urinary Tract: Adrenal glands are within normal limits. Kidneys demonstrate a normal enhancement pattern bilaterally. Large left renal cyst is noted simple in nature measuring 4.9 cm. No obstructive changes are seen. The bladder is decompressed. Stomach/Bowel: The appendix is within normal limits. No obstructive or inflammatory changes of the colon are seen. Small bowel and stomach  appear within normal limits. Lymphatic: No sizable lymphadenopathy is noted. Reproductive: Uterus and bilateral adnexa are unremarkable. Other: No abdominal wall hernia or abnormality. No abdominopelvic ascites. Musculoskeletal: No acute or significant osseous findings. Review of the MIP images confirms the above findings. IMPRESSION: No evidence of aortic dissection or aneurysmal dilatation. No evidence of pulmonary emboli. 4 mm left solid pulmonary nodule. No routine follow-up imaging is recommended per Fleischner Society Guidelines. These guidelines do not apply to immunocompromised patients and patients with cancer. Follow up in patients with significant comorbidities as clinically warranted. For lung cancer screening, adhere to Lung-RADS guidelines. Reference: Radiology. 2017; 284(1):228-43. No acute abnormality in the abdomen is noted. Electronically Signed   By: Inez Catalina M.D.   On: 01/02/2021 23:33   Results for orders placed or performed during the hospital encounter of 01/02/21  Resp Panel by RT-PCR (Flu A&B, Covid) Nasopharyngeal Swab     Status: None   Collection Time: 01/02/21 11:06 PM   Specimen: Nasopharyngeal Swab; Nasopharyngeal(NP) swabs in vial transport medium  Result Value Ref Range Status   SARS Coronavirus 2 by RT PCR NEGATIVE NEGATIVE Final    Comment: (NOTE) SARS-CoV-2 target nucleic acids are NOT DETECTED.  The SARS-CoV-2 RNA is generally detectable in upper respiratory specimens during the acute phase of infection. The lowest concentration of SARS-CoV-2 viral copies this assay can detect is 138 copies/mL. A negative result does not preclude SARS-Cov-2 infection and should not be used as the  sole basis for treatment or other patient management decisions. A negative result may occur with  improper specimen collection/handling, submission of specimen other than nasopharyngeal swab, presence of viral mutation(s) within the areas targeted by this assay, and inadequate  number of viral copies(<138 copies/mL). A negative result must be combined with clinical observations, patient history, and epidemiological information. The expected result is Negative.  Fact Sheet for Patients:  BloggerCourse.com  Fact Sheet for Healthcare Providers:  SeriousBroker.it  This test is no t yet approved or cleared by the [place] FDA and  has been authorized for detection and/or diagnosis of SARS-CoV-2 by FDA under an Emergency Use Authorization (EUA). This EUA will remain  in effect (meaning this test can be used) for the duration of the COVID-19 declaration under Section 564(b)(1) of the Act, 21 U.S.C.section 360bbb-3(b)(1), unless the authorization is terminated  or revoked sooner.       Influenza A by PCR NEGATIVE NEGATIVE Final   Influenza B by PCR NEGATIVE NEGATIVE Final    Comment: (NOTE) The Xpert Xpress SARS-CoV-2/FLU/RSV plus assay is intended as an aid in the diagnosis of influenza from Nasopharyngeal swab specimens and should not be used as a sole basis for treatment. Nasal washings and aspirates are unacceptable for Xpert Xpress SARS-CoV-2/FLU/RSV testing.  Fact Sheet for Patients: BloggerCourse.com  Fact Sheet for Healthcare Providers: SeriousBroker.it  This test is not yet approved or cleared by the [place] FDA and has been authorized for detection and/or diagnosis of SARS-CoV-2 by FDA under an Emergency Use Authorization (EUA). This EUA will remain in effect (meaning this test can be used) for the duration of the COVID-19 declaration under Section 564(b)(1) of the Act, 21 U.S.C. section 360bbb-3(b)(1), unless the authorization is terminated or revoked.  Performed at [facility], [address] [place], [place] [address]    Recent Labs  Lab 01/02/21 1451  WBC 11.6*  HGB 14.4  HCT 43.5  MCV 91.2  PLT 271    Recent Labs  Lab 01/02/21 1451  NA 136  K 3.6  CL 105  CO2 23  GLUCOSE 98  BUN 21*  CREATININE 0.95  CALCIUM 9.0   Recent Labs  Lab 01/02/21 2327  AST 22  ALT 36  ALKPHOS 66  BILITOT 0.8  PROT 6.2*  ALBUMIN 3.7   Recent Labs  Lab 01/02/21 1448  GLUCAP 99    Author: Lynden Oxford [facility]

## 2021-05-04 ENCOUNTER — Ambulatory Visit: Payer: PRIVATE HEALTH INSURANCE | Admitting: Primary Care

## 2021-05-14 ENCOUNTER — Ambulatory Visit: Payer: PRIVATE HEALTH INSURANCE | Admitting: Internal Medicine

## 2021-05-14 NOTE — Progress Notes (Deleted)
? ?Bianca Hernandez, female    DOB: 1965/11/20,   MRN: [id number] ? ? ?Brief patient profile:  ?56   yo never regular smoker raised in [place] asthma [place] child went to college in [place] on prn saba > saw pulmonary Olevia Perches  Around 2011 best rx symbicort and arrived Aug 1 to [place] with onset Sep 11 2019 fever HA  Dx as [facility] rx pred/ zpak  Then infusion on 8/17 and gradually improved  But could not stay on the breztri due to cough so just uses neb at hs  ? ? ? ? ?History of Present Illness  ?10/03/2019  Pulmonary/ 1st office eval/Bianca Hernandez  ON breztri  Off prednisone x one weeks prior to OV   ?Chief Complaint  ?Patient presents with  ? Consult  ?  pt is here to establish with pulmonolgist takes meds to tx syms  ?Dyspnea:  Great until unable to use breztri substitute for symb 160 ?Cough: white mucus ?Sleep: fine  ?SABA use: nebulizer at bedtime  ?02 none ?Rec ?Plan A = Automatic = Always=    Breztri Take 0- 2 puffs first thing in am and then another 2 puffs about 12 hours later thru spacer  ?Work on inhaler technique:     ?Plan B = Backup (to supplement plan A, not to replace it) ?Only use your albuterol (ventolin) inhaler as a rescue medication ?Plan C = Crisis (instead of Plan B but only if Plan B stops working) ?- only use your albuterol nebulizer if you first try Plan B  ?If increased need for nebulizer > Prednisone 10 mg take  4 each am x 2 days,   2 each am x 2 days,  1 each am x 2 days and stop  ?Stop lisinopril and start valsartan 80 mg daily  ?Try prilosec otc 20mg   Take 30-60 min before first meal of the day and Pepcid ac (famotidine) 20 mg one @  bedtime until cough is completely gone for at least a week   ?Best cough syrup is delsym  2 tsp every 12 hours as needed  ?Please schedule a follow up office visit in 6 weeks, call sooner if needed  ?LATE ADD: change breztri to symb 80 Take 2 puffs first thing in am and then another 2 puffs about 12 hours later as this is covered best by insurance and least likely to make her  cough ? ?  ? ?05/14/2021  f/u ov/Bianca Hernandez re: ***   maint on ***  ?No chief complaint on file. ?  ?Dyspnea:  *** ?Cough: *** ?Sleeping: *** ?SABA use: *** ?02: *** ?Covid status:   *** ? ? ?No obvious day to day or daytime variability or assoc excess/ purulent sputum or mucus plugs or hemoptysis or cp or chest tightness, subjective wheeze or overt sinus or hb symptoms.  ? ?*** without nocturnal  or early am exacerbation  of respiratory  c/o's or need for noct saba. Also denies any obvious fluctuation of symptoms with weather or environmental changes or other aggravating or alleviating factors except as outlined above  ? ?No unusual exposure hx or h/o childhood pna/ asthma or knowledge of premature birth. ? ?Current Allergies, Complete Past Medical History, Past Surgical History, Family History, and Social History were reviewed in [facility] record. ? ?ROS  The following are not active complaints unless bolded ?Hoarseness, sore throat, dysphagia, dental problems, itching, sneezing,  nasal congestion or discharge of excess mucus or purulent secretions, ear ache,  fever, chills, sweats, unintended wt loss or wt gain, classically pleuritic or exertional cp,  orthopnea pnd or arm/hand swelling  or leg swelling, presyncope, palpitations, abdominal pain, anorexia, nausea, vomiting, diarrhea  or change in bowel habits or change in bladder habits, change in stools or change in urine, dysuria, hematuria,  rash, arthralgias, visual complaints, headache, numbness, weakness or ataxia or problems with walking or coordination,  change in mood or  memory. ?      ? ?No outpatient medications have been marked as taking for the 05/14/21 encounter (Appointment) with Tanda Rockers, MD.  ?    ?  ? ? ? ?Objective:  ?  ? ?Wt Readings from Last 3 Encounters:  ?01/02/21 212 lb 9.6 oz (96.4 kg)  ?10/03/19 199 lb 9.6 oz (90.5 kg)  ?09/20/19 214 lb (97.1 kg)  ?  ? ? ?Vital signs reviewed  05/14/2021  - Note at rest 02 sats   ***% on ***  ? ?General appearance:    ***   ? ?  ? ?  ?  ? ?   ?Assessment  ?  ? ?  ?  ? ? ? ? ?  ? ? ?  ?

## 2021-06-03 ENCOUNTER — Ambulatory Visit (INDEPENDENT_AMBULATORY_CARE_PROVIDER_SITE_OTHER): Payer: PRIVATE HEALTH INSURANCE | Admitting: Internal Medicine

## 2021-06-03 ENCOUNTER — Encounter: Payer: Self-pay | Admitting: Internal Medicine

## 2021-06-03 DIAGNOSIS — J45991 Cough variant asthma: Secondary | ICD-10-CM | POA: Diagnosis not present

## 2021-06-03 DIAGNOSIS — I10 Essential (primary) hypertension: Secondary | ICD-10-CM

## 2021-06-03 MED ORDER — ALBUTEROL SULFATE (2.5 MG/3ML) 0.083% IN NEBU: 2.5000 mg | INHALATION_SOLUTION | Freq: Four times a day (QID) | RESPIRATORY_TRACT | 1 refills | Status: AC | PRN

## 2021-06-03 MED ORDER — ALBUTEROL SULFATE HFA 108 (90 BASE) MCG/ACT IN AERS: INHALATION_SPRAY | RESPIRATORY_TRACT | 1 refills | Status: AC

## 2021-06-03 MED ORDER — PREDNISONE 10 MG PO TABS: ORAL_TABLET | ORAL | 0 refills | Status: AC

## 2021-06-03 MED ORDER — HYDROCHLOROTHIAZIDE 25 MG PO TABS: 25.0000 mg | ORAL_TABLET | Freq: Every day | ORAL | 2 refills | Status: AC

## 2021-06-03 NOTE — Progress Notes (Signed)
? ?Bianca Hernandez, female    DOB: 20-Dec-1965,   MRN: [id number] ? ? ?Brief patient profile:  ?56   yo never regular smoker raised [place] asthma as child went to college in [place] on prn saba > saw pulmonary Cathlean [place]  Around 2011 best rx symbicort and arrived Aug 1 to [place] with onset Sep 11 2019 fever HA  Dx as Merleen Nicely rx pred/ zpak  Then infusion on 8/17 and gradually improved  But could not stay on the breztri due to cough so just uses neb at hs  ? ? ?History of Present Illness  ?10/03/2019  Pulmonary/ 1st office eval/Bianca Hernandez  ON breztri  Off prednisone x one weeks prior to OV   ?Chief Complaint  ?Patient presents with  ? Consult  ?  pt is here to establish with pulmonolgist takes meds to tx syms  ?Dyspnea:  Great until unable to use breztri substitute for symb 160 ?Cough: white mucus ?Sleep: fine  ?SABA use: nebulizer at bedtime  ?02 none ?Rec ?Plan A = Automatic = Always=    Breztri Take 0- 2 puffs first thing in am and then another 2 puffs about 12 hours later thru spacer  ?Work on inhaler technique:     ?Plan B = Backup (to supplement plan A, not to replace it) ?Only use your albuterol (ventolin) inhaler as a rescue medication ?Plan C = Crisis (instead of Plan B but only if Plan B stops working) ?- only use your albuterol nebulizer if you first try Plan B  ?If increased need for nebulizer > Prednisone 10 mg take  4 each am x 2 days,   2 each am x 2 days,  1 each am x 2 days and stop  ?Stop lisinopril and start valsartan 80 mg daily  ?Try prilosec otc 20mg   Take 30-60 min before first meal of the day and Pepcid ac (famotidine) 20 mg one @  bedtime until cough is completely gone for at least a week   ?Best cough syrup is delsym  2 tsp every 12 hours as needed  ?Please schedule a follow up office visit in 6 weeks, call sooner if needed  ?LATE ADD: change breztri to symb 80 Take 2 puffs first thing in am and then another 2 puffs about 12 hours later as this is covered best by insurance and least likely to make her cough ? ?   ? ?06/03/2021  f/u ov/Bianca Hernandez re: asthma/hbp maint on symbicort 80  ? Adherent as not seen here in > 1 y/ pharmacy recs requested  ?Chief Complaint  ?Patient presents with  ? Acute visit   ?  Increased SOB and wheezing x 2 months. She is using her albuterol inhaler 2-3 x per day.   ?Dyspnea: still exercising / main problem is noct wheeze  ?Cough: no more cough  ?Sleeping: bed is flat  ?SABA use: last used 12 h prior to OV,  last neb  ?02: none  ?Covid status:   vax none, infected  8/201  ? ? ?No obvious day to day or daytime variability or assoc excess/ purulent sputum or mucus plugs or hemoptysis or cp or chest tightness, subjective wheeze or overt sinus or hb symptoms.  ?  ?Also denies any obvious fluctuation of symptoms with weather or environmental changes or other aggravating or alleviating factors except as outlined above  ? ?No unusual exposure hx or h/o childhood pna or knowledge of premature birth. ? ?Current Allergies, Complete Past Medical History, Past Surgical  History, Family History, and Social History were reviewed in [employer] [facility] record. ? ?ROS  The following are not active complaints unless bolded ?Hoarseness, sore throat, dysphagia, dental problems, itching, sneezing,  nasal congestion or discharge of excess mucus or purulent secretions, ear ache,   fever, chills, sweats, unintended wt loss or wt gain, classically pleuritic or exertional cp,  orthopnea pnd or arm/hand swelling  or leg swelling, presyncope, palpitations, abdominal pain, anorexia, nausea, vomiting, diarrhea  or change in bowel habits or change in bladder habits, change in stools or change in urine, dysuria, hematuria,  rash, arthralgias, visual complaints, headache, numbness, weakness or ataxia or problems with walking or coordination,  change in mood or  memory. ?      ? ?Current Meds  ?Medication Sig  ? albuterol (VENTOLIN HFA) 108 (90 Base) MCG/ACT inhaler INHALE 1-2 PUFFS BY MOUTH EVERY 6 HOURS AS NEEDED FOR  WHEEZE OR SHORTNESS OF BREATH  ? budesonide-formoterol (SYMBICORT) 80-4.5 MCG/ACT inhaler Inhale 2 puffs into the lungs 2 (two) times daily. in the morning and at bedtime.  ?     ? valsartan (DIOVAN) 80 MG tablet Take 1 tablet (80 mg total) by mouth daily.  ?    ?  ? ? ? ?Objective:  ?  ? ?Wt Readings from Last 3 Encounters:  ?06/03/21 223 lb (101.2 kg)  ?01/02/21 212 lb 9.6 oz (96.4 kg)  ?10/03/19 199 lb 9.6 oz (90.5 kg)  ?  ? ? ?Vital signs reviewed  06/03/2021  - Note at rest 02 sats  98% on RA  ? ?General appearance:    amb female nad / classic pseudowheeze on FVC  ? ? HEENT : orophx clerar   ? ? ?NECK :  without JVD/Nodes/TM/ nl carotid upstrokes bilaterally ? ? ?LUNGS: no acc muscle use,  Nl contour chest which is clear to A and P bilaterally without cough on insp or exp maneuvers ? ? ?CV:  RRR  no s3 or murmur or increase in P2, and no edema  ? ?ABD:  soft and nontender with nl inspiratory excursion in the supine position. No bruits or organomegaly appreciated, bowel sounds nl ? ?MS:  Nl gait/ ext warm without deformities, calf tenderness, cyanosis or clubbing ?No obvious joint restrictions  ? ?SKIN: warm and dry without lesions   ? ?NEURO:  alert, approp, nl sensorium with  no motor or cerebellar deficits apparent.    ? ?  ? ? ?I personally reviewed images and agree with radiology impression as follows:  ? Chest CTa  01/02/21  ?No evidence of aortic dissection or aneurysmal dilatation. ?No evidence of pulmonary emboli. ? 4 mm left solid pulmonary nodule. No routine follow-up imaging is ?recommended per Fleischner Society Guidelines  ?  ? ?   ?Assessment  ?  ? ?  ?  ? ? ? ? ?  ? ? ?  ?

## 2021-06-03 NOTE — Patient Instructions (Signed)
When wheeze or cough flare:  Try prilosec otc 20mg   Take 30-60 min before first meal of the day and Pepcid ac (famotidine) 20 mg one @  bedtime until flare completely gone for at least a week without the need for cough suppression ? ?GERD (REFLUX)  is an extremely common cause of respiratory symptoms just like yours , many times with no obvious heartburn at all.  ? ? It can be treated with medication, but also with lifestyle changes including elevation of the head of your bed (ideally with 6 -8inch blocks under the headboard of your bed),  Smoking cessation, avoidance of late meals, excessive alcohol, and avoid fatty foods, chocolate, peppermint, colas, red wine, and acidic juices such as orange juice.  ?NO MINT OR MENTHOL PRODUCTS SO NO COUGH DROPS  ?USE SUGARLESS CANDY INSTEAD (Jolley ranchers or Stover's or Life Savers) or even ice chips will also do - the key is to swallow to prevent all throat clearing. ?NO OIL BASED VITAMINS - use powdered substitutes.  Avoid fish oil when coughing.  ? ? ?Plan A = Automatic = Always=    Symbicort 80 Take 2 puffs first thing in am and then another 2 puffs about 12 hours later.  ?  ?Work on inhaler technique:  relax and gently blow all the way out then take a nice smooth full deep breath back in, triggering the inhaler at same time you start breathing in.  Hold for up to 5 seconds if you can. Blow out thru nose. Rinse and gargle with water when done.  If mouth or throat bother you at all,  try brushing teeth/gums/tongue with arm and hammer toothpaste/ make a slurry and gargle and spit out.  ? ?   ?Plan B = Backup (to supplement plan A, not to replace it) ?Only use your albuterol inhaler as a rescue medication to be used if you can't catch your breath by resting or doing a relaxed purse lip breathing pattern.  ?- The less you use it, the better it will work when you need it. ?- Ok to use the inhaler up to 2 puffs  every 4 hours if you must but call for appointment if use goes up  over your usual need ?- Don't leave home without it !!  (think of it like the spare tire for your car)  ? ?Plan C = Crisis (instead of Plan B but only if Plan B stops working) ?- only use your albuterol nebulizer if you first try Plan B and it fails to help > ok to use the nebulizer up to every 4 hours but if start needing it regularly call for immediate appointment ? ?  ?Please schedule a follow up visit in 3 months but call sooner if needed  with all medications /inhalers/ solutions in hand so we can verify exactly what you are taking. This includes all medications from all doctors and over the counters  ? ? ?   ?

## 2021-06-03 NOTE — Assessment & Plan Note (Signed)
Onset childhood previously controlled on symb 160 2bid prior to covid ?- D/c acei 10/03/2019  And rx gerd short term  ?- 10/05/2019  Changed to symb 80 2bid best choice on insurance  plan  ?- 06/03/2021  After extensive coaching inhaler device,  effectiveness =    75% (short Ti)  ? ?DDX of  difficult airways management almost all start with A and  include Adherence, Ace Inhibitors, Acid Reflux, Active Sinus Disease, Alpha 1 Antitripsin deficiency, Anxiety masquerading as Airways dz,  ABPA,  Allergy(esp in young), Aspiration (esp in elderly), Adverse effects of meds,  Active smoking or vaping, A bunch of PE's (a small clot burden can't cause this syndrome unless there is already severe underlying pulm or vascular dz with poor reserve) plus two Bs  = Bronchiectasis and Beta blocker use..and one C= CHF ? ?Adherence is always the initial "prime suspect" and is a multilayered concern that requires a "trust but verify" approach in every patient - starting with knowing how to use medications, especially inhalers, correctly, keeping up with refills and understanding the fundamental difference between maintenance and prns vs those medications only taken for a very short course and then stopped and not refilled.  ?- see hfa teaching ?- pharmacy records x 3 m ?- return with all meds in hand using a trust but verify approach to confirm accurate Medication  Reconciliation The principal here is that until we are certain that the  patients are doing what we've asked, it makes no sense to ask them to do more.  ? ?? Acid (or non-acid) GERD > always difficult to exclude as up to 75% of pts in some series report no assoc GI/ Heartburn symptoms> rec max (24h)  acid suppression and diet restrictions/ reviewed and instructions given in writing.  ? ?F/u in 4 weeks  ?

## 2021-06-03 NOTE — Assessment & Plan Note (Signed)
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 > improved  ? ?Although even in retrospect it may not be clear the ACEi contributed to the pt's symptoms,  Pt improved off them and adding them back at this point or in the future would risk confusion in interpretation of non-specific respiratory symptoms to which this patient is prone  ie  Better not to muddy the waters here.  ? ?>>> continue diovan 80 mg but add hctz 25 mg daily  ? ?I had an extended discussion with the patient and reviewed all relevant studies so total time was 30 minutes with moderate level of MDM. ? ?  ?    ?  ? ?Each maintenance medication was reviewed in detail including emphasizing most importantly the difference between maintenance and prns and under what circumstances the prns are to be triggered using an action plan format where appropriate. ? ?Total time for H and P, chart review, counseling, reviewing when to use which hfa/neb  device(s) and generating customized AVS unique to this office visit / same day charting = > 30 min for pt not seen in over a year.  ?     ?

## 2021-06-17 ENCOUNTER — Other Ambulatory Visit: Payer: Self-pay | Admitting: Internal Medicine

## 2021-07-05 NOTE — Progress Notes (Deleted)
Bianca Hernandez, female    DOB: [birth year],   MRN: [id number]   Brief patient profile:  56   yo never regular smoker raised [place] asthma as child went to college in [place] on prn saba > saw pulmonary [place]  Around 2011 best rx symbicort and arrived Aug 1 to [place] with onset Sep 11 2019 fever HA  Dx as covid rx pred/ zpak  Then infusion on 8/17 and gradually improved  But could not stay on the breztri due to cough so just uses neb at hs    History of Present Illness  10/03/2019  Pulmonary/ 1st office eval/Bianca Hernandez  ON breztri  Off prednisone x one weeks prior to [place]  Patient presents with   Consult    pt is here to establish with pulmonolgist takes meds to tx syms  Dyspnea:  Great until unable to use breztri substitute for symb 160 Cough: white mucus Sleep: fine  SABA use: nebulizer at bedtime  02 none Rec Plan A = Automatic = Always=    Breztri Take 0- 2 puffs first thing in am and then another 2 puffs about 12 hours later thru spacer  Work on inhaler technique:     Plan B = Backup (to supplement plan A, not to replace it) Only use your albuterol (ventolin) inhaler as a rescue medication Plan C = Crisis (instead of Plan B but only if Plan B stops working) - only use your albuterol nebulizer if you first try Plan B  If increased need for nebulizer > Prednisone 10 mg take  4 each am x 2 days,   2 each am x 2 days,  1 each am x 2 days and stop  Stop lisinopril and start valsartan 80 mg daily  Try prilosec otc 20mg   Take 30-60 min before first meal of the day and Pepcid ac (famotidine) 20 mg one @  bedtime until cough is completely gone for at least a week   Best cough syrup is delsym  2 tsp every 12 hours as needed  Please schedule a follow up office visit in 6 weeks, call sooner if needed  LATE ADD: change breztri to symb 80 Take 2 puffs first thing in am and then another 2 puffs about 12 hours later as this is covered best by insurance and least likely to make her cough      06/03/2021  f/u ov/Bianca Hernandez re: asthma/hbp maint on symbicort 80  ? Adherent as not seen here in > 1 y/ pharmacy recs requested  Chief Complaint  Patient presents with   Acute visit     Increased SOB and wheezing x 2 months. She is using her albuterol inhaler 2-3 x per day.   Dyspnea: still exercising / main problem is noct wheeze  Cough: no more cough  Sleeping: bed is flat  SABA use: last used 12 h prior to OV,  last neb  02: none  Covid status:   vax none, infected  09/2019  Rec When wheeze or cough flare:  Try prilosec otc 20mg   Take 30-60 min before first meal of the day and Pepcid ac (famotidine) 20 mg one @  bedtime   GERD diet reviewed, bed blocks rec   Plan A = Automatic = Always=    Symbicort 80 Take 2 puffs first thing in am and then another 2 puffs about 12 hours later.   Work on inhaler technique:   Plan B = Backup (to supplement  plan A, not to replace it) Only use your albuterol inhaler as a rescue medication  Plan C = Crisis (instead of Plan B but only if Plan B stops working) - only use your albuterol nebulizer if you first try Plan B and it fails to help > ok to use the nebulizer up to every 4 hours but if start needing it regularly call for immediate appointment   Please schedule a follow up visit in 3 months but call sooner if needed  with all medications /inhalers/ solutions in hand    07/06/2021  f/u ov/Bianca Hernandez re: cough variant asthma   maint on ***  No chief complaint on file.   Dyspnea:  *** Cough: *** Sleeping: *** SABA use: *** 02: *** Covid status:   ***   No obvious day to day or daytime variability or assoc excess/ purulent sputum or mucus plugs or hemoptysis or cp or chest tightness, subjective wheeze or overt sinus or hb symptoms.   *** without nocturnal  or early am exacerbation  of respiratory  c/o's or need for noct saba. Also denies any obvious fluctuation of symptoms with weather or environmental changes or other aggravating or alleviating factors  except as outlined above   No unusual exposure hx or h/o childhood pna/ asthma or knowledge of premature birth.  Current Allergies, Complete Past Medical History, Past Surgical History, Family History, and Social History were reviewed in [employer] record.  ROS  The following are not active complaints unless bolded Hoarseness, sore throat, dysphagia, dental problems, itching, sneezing,  nasal congestion or discharge of excess mucus or purulent secretions, ear ache,   fever, chills, sweats, unintended wt loss or wt gain, classically pleuritic or exertional cp,  orthopnea pnd or arm/hand swelling  or leg swelling, presyncope, palpitations, abdominal pain, anorexia, nausea, vomiting, diarrhea  or change in bowel habits or change in bladder habits, change in stools or change in urine, dysuria, hematuria,  rash, arthralgias, visual complaints, headache, numbness, weakness or ataxia or problems with walking or coordination,  change in mood or  memory.        No outpatient medications have been marked as taking for the 07/06/21 encounter (Appointment) with Tanda Rockers, MD.           Objective:     Wts                07/06/2021   06/03/21 223 lb (101.2 kg)  01/02/21 212 lb 9.6 oz (96.4 kg)  10/03/19 199 lb 9.6 oz (90.5 kg)    Vital signs reviewed  07/06/2021  - Note at rest 02 sats  ***% on ***   General appearance:    ***           Assessment

## 2021-07-06 ENCOUNTER — Ambulatory Visit: Payer: PRIVATE HEALTH INSURANCE | Admitting: Internal Medicine

## 2021-07-15 ENCOUNTER — Encounter (HOSPITAL_COMMUNITY): Payer: Self-pay | Admitting: Emergency Medicine

## 2021-07-15 ENCOUNTER — Emergency Department (HOSPITAL_COMMUNITY)
Admission: EM | Admit: 2021-07-15 | Discharge: 2021-07-15 | Disposition: A | Discharge status: Home / Self Care | Payer: 59 | Attending: Emergency Medicine | Admitting: Emergency Medicine

## 2021-07-15 ENCOUNTER — Other Ambulatory Visit: Payer: Self-pay

## 2021-07-15 DIAGNOSIS — R040 Epistaxis: Secondary | ICD-10-CM | POA: Diagnosis present

## 2021-07-15 DIAGNOSIS — I10 Essential (primary) hypertension: Secondary | ICD-10-CM | POA: Insufficient documentation

## 2021-07-15 DIAGNOSIS — Z79899 Other long term (current) drug therapy: Secondary | ICD-10-CM | POA: Diagnosis not present

## 2021-07-15 MED ORDER — IRBESARTAN 75 MG PO TABS
75.0000 mg | ORAL_TABLET | Freq: Every day | ORAL | Status: DC
  Administered 2021-07-15: 75 mg via ORAL
  Filled 2021-07-15: qty 1

## 2021-07-15 MED ORDER — OXYMETAZOLINE HCL 0.05 % NA SOLN
1.0000 | Freq: Once | NASAL | Status: AC
  Administered 2021-07-15: 1 via NASAL
  Filled 2021-07-15: qty 30

## 2021-07-15 MED ORDER — HYDROCHLOROTHIAZIDE 25 MG PO TABS
25.0000 mg | ORAL_TABLET | Freq: Every day | ORAL | Status: DC
Start: 2021-07-15 — End: 2021-07-15
  Administered 2021-07-15: 25 mg via ORAL
  Filled 2021-07-15: qty 1

## 2021-07-15 NOTE — ED Triage Notes (Signed)
Pt BIB [facility] due to hypertensive nosebleed.  No hx of blood thinners. 30 minutes nosebleed then decided to call [facility]; right nostril is oozing more blood and pt can feel in the "back of the throat".  Pt endorses using allergie spray more than normal.  BP 270/140 standing; sitting 220/70.  BP meds normally taken except today.  No N/V, just spitting up of blood.

## 2021-07-15 NOTE — ED Notes (Signed)
MD sprayed afrin spray to right nostril. Education provided by MD. MD is aware of pt bp. Pt has not taken her bp meds today.

## 2021-07-15 NOTE — ED Provider Notes (Signed)
[facility] EMERGENCY DEPARTMENT Provider Note   CSN: [id number] Arrival date & time: 07/15/21  1425     History  Chief Complaint  Patient presents with   Epistaxis    Bianca Hernandez is a 56 y.o. female.  Patient with history of high blood pressure who presents with right-sided nosebleed.  Started just prior to arrival.  She thinks that it is mostly stopped.  Blood pressure was elevated with EMS.  She is not on blood thinners.  She has been having a lot of allergy symptoms.  Denies any chest pain, shortness of breath, lightheadedness.  No history of the same.  Nothing has made it worse but pressure has made it better.  The history is provided by the patient.       Home Medications Prior to Admission medications   Medication Sig Start Date End Date Taking? Authorizing Provider  albuterol (PROAIR HFA) 108 (90 Base) MCG/ACT inhaler 2 puffs every 4 hours as needed only  if your can't catch your breath 06/03/21   Nyoka Cowden, MD  albuterol (PROVENTIL) (2.5 MG/3ML) 0.083% nebulizer solution Take 3 mLs (2.5 mg total) by nebulization every 6 (six) hours as needed for wheezing or shortness of breath. 06/03/21   Nyoka Cowden, MD  albuterol (VENTOLIN HFA) 108 (90 Base) MCG/ACT inhaler INHALE 1-2 PUFFS BY MOUTH EVERY 6 HOURS AS NEEDED FOR WHEEZE OR SHORTNESS OF BREATH 04/21/20   Nyoka Cowden, MD  budesonide-formoterol (SYMBICORT) 80-4.5 MCG/ACT inhaler INHALE 2 PUFFS INTO THE LUNGS 2 (TWO) TIMES DAILY. IN THE MORNING AND AT BEDTIME. 06/17/21   Nyoka Cowden, MD  hydrochlorothiazide (HYDRODIURIL) 25 MG tablet Take 1 tablet (25 mg total) by mouth daily. 01/03/21   Rolly Salter, MD  hydrochlorothiazide (HYDRODIURIL) 25 MG tablet Take 1 tablet (25 mg total) by mouth daily. 06/03/21   Nyoka Cowden, MD  predniSONE (DELTASONE) 10 MG tablet Take  4 each am x 2 days,   2 each am x 2 days,  1 each am x 2 days and stop 06/03/21   Nyoka Cowden, MD  valsartan (DIOVAN) 80 MG tablet  Take 1 tablet (80 mg total) by mouth daily. 01/03/21   Rolly Salter, MD      Allergies    Patient has no known allergies.    Review of Systems   Review of Systems  Physical Exam Updated Vital Signs BP (!) 188/97 (BP Location: Right Arm)   Pulse 78   Temp 98.1 F (36.7 C) (Oral)   Resp 15   Ht 5\' 6"  (1.676 m)   Wt 83.9 kg   SpO2 99%   BMI 29.86 kg/m  Physical Exam Vitals and nursing note reviewed.  Constitutional:      General: She is not in acute distress.    Appearance: She is well-developed.  HENT:     Head: Normocephalic and atraumatic.     Nose:     Comments: Some dryness to the right nostril but no signs of active bleeding or clot, posterior oropharynx is clear Eyes:     Conjunctiva/sclera: Conjunctivae normal.  Cardiovascular:     Rate and Rhythm: Normal rate and regular rhythm.     Heart sounds: No murmur heard. Pulmonary:     Effort: Pulmonary effort is normal. No respiratory distress.     Breath sounds: Normal breath sounds.  Abdominal:     Palpations: Abdomen is soft.     Tenderness: There is no abdominal tenderness.  Musculoskeletal:        General: No swelling.     Cervical back: Neck supple.  Skin:    General: Skin is warm and dry.     Capillary Refill: Capillary refill takes less than 2 seconds.  Neurological:     Mental Status: She is alert.  Psychiatric:        Mood and Affect: Mood normal.     ED Results / Procedures / Treatments   Labs (all labs ordered are listed, but only abnormal results are displayed) Labs Reviewed - No data to display  EKG None  Radiology No results found.  Procedures Procedures    Medications Ordered in ED Medications  hydrochlorothiazide (HYDRODIURIL) tablet 25 mg (25 mg Oral Given 07/15/21 1606)  irbesartan (AVAPRO) tablet 75 mg (75 mg Oral Given 07/15/21 1606)  oxymetazoline (AFRIN) 0.05 % nasal spray 1 spray (1 spray Each Nare Given 07/15/21 1504)    ED Course/ Medical Decision Making/ A&P                            Medical Decision Making Risk OTC drugs. Prescription drug management.   Bianca Hernandez is here with right-sided nosebleed.  Blood pressure 188/97 but otherwise normal vitals.  Has not taken her blood pressure medicine today.  She has had a lot of allergy symptoms last week.  Has been using her Flonase.  Had nosebleed that has stopped upon arrival here.  She was given Afrin.  She is on blood thinners.  Patient was given her blood pressure medications.  She has been observed for over an hour with no breakthrough bleeding.  I have instructed her on how to use Afrin and nasal compression to help with any future bleeds.  Overall she is well-appearing.  Discharged in good condition.  Recommend follow-up with primary care doctor for further blood pressure management.  This chart was dictated using voice recognition software.  Despite best efforts to proofread,  errors can occur which can change the documentation meaning.         Final Clinical Impression(s) / ED Diagnoses Final diagnoses:  Right-sided epistaxis    Rx / DC Orders ED Discharge Orders     None         Virgina Norfolk, DO 07/15/21 1626

## 2021-07-15 NOTE — Discharge Instructions (Signed)
Follow-up with your primary care doctor for further blood pressure management.  If you have rebleed, use 2 sprays of Afrin in your nostril and hold pressure for 15 minutes as discussed.  Repeat this 1 time and then consider coming to the ED for further evaluation.

## 2021-07-20 ENCOUNTER — Other Ambulatory Visit: Payer: Self-pay | Admitting: Internal Medicine

## 2021-10-21 IMAGING — DX DG CHEST 2V
2 series · 2 of 2 positions shown · non-contrast
Comparison: None.

CLINICAL DATA: Fever. Cough. Shortness of breath. Chronic asthma.
Nonsmoker.

EXAM:
CHEST - 2 VIEW

[chest pa]
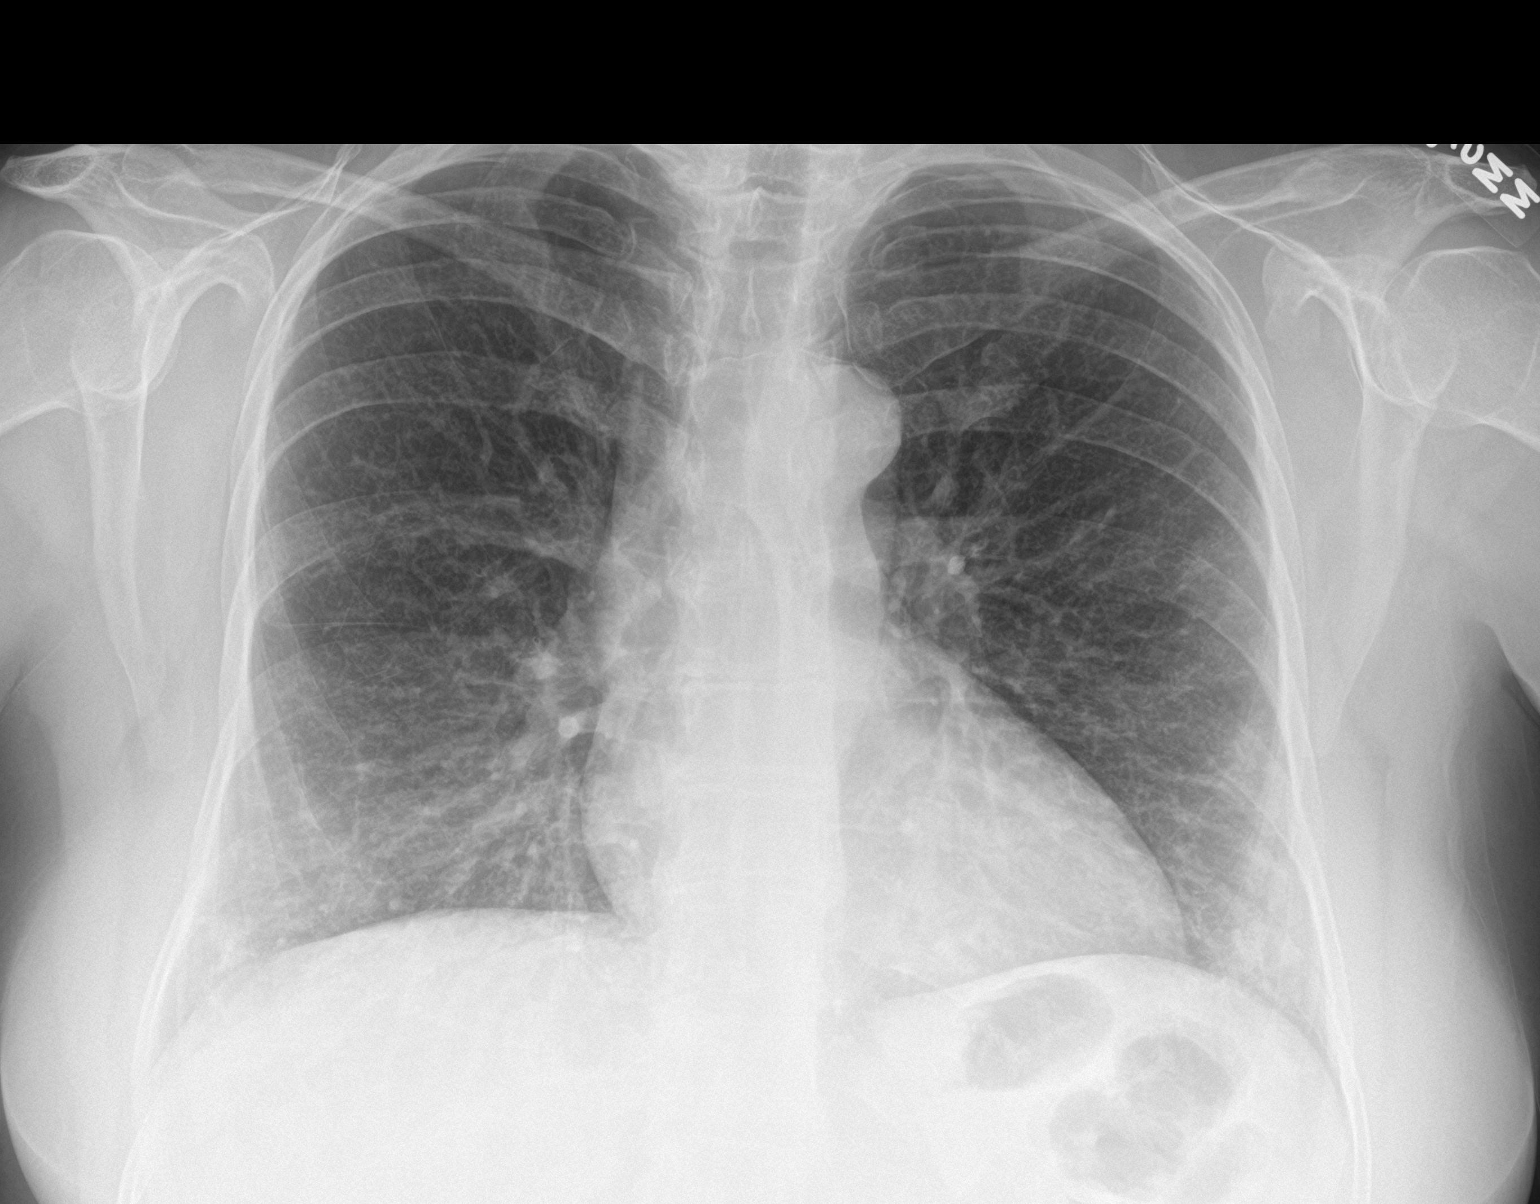

[chest lat]
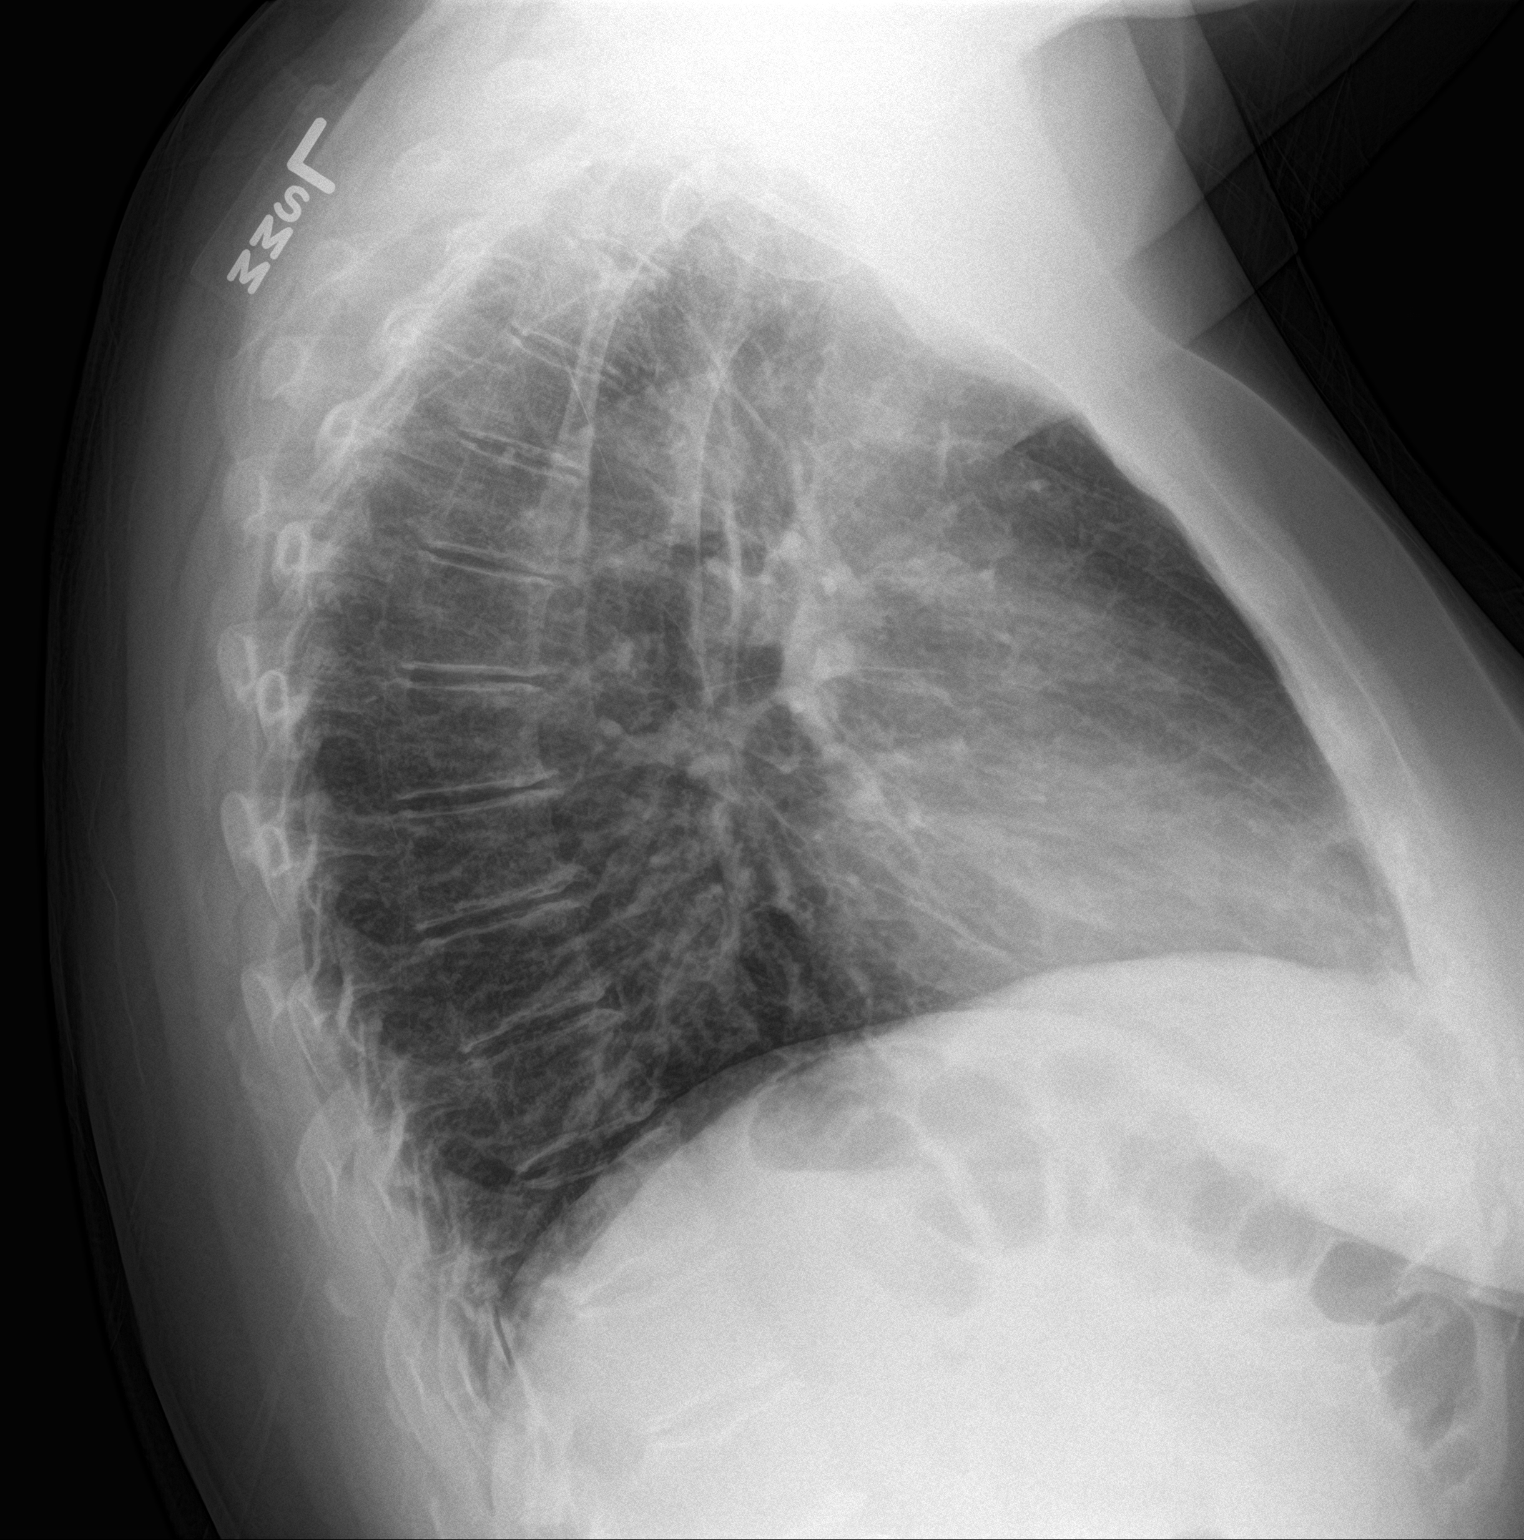

[2 of 2 positions shown; findings below may reference images not displayed]

FINDINGS: Midline trachea. Borderline cardiomegaly. Mediastinal contours
otherwise within normal limits. No pleural effusion or pneumothorax.
Lower lung predominant interstitial thickening is mild. Subtle
peripheral lower lobe pulmonary opacities, slightly greater on the
left than right
IMPRESSION: Subtle peripheral and basilar opacities, suspicious for pneumonia.
Atypical/viral etiologies, including [id number] pneumonia are
concerns.

Underlying mild pulmonary interstitial thickening, likely related to
the clinical history of chronic asthma/bronchitis.

## 2021-10-26 IMAGING — DX DG CHEST 2V
2 series · 2 of 2 positions shown · non-contrast
Comparison: September 15, 2019

CLINICAL DATA: Shortness of breath x4 days.

EXAM:
CHEST - 2 VIEW

[chest pa]
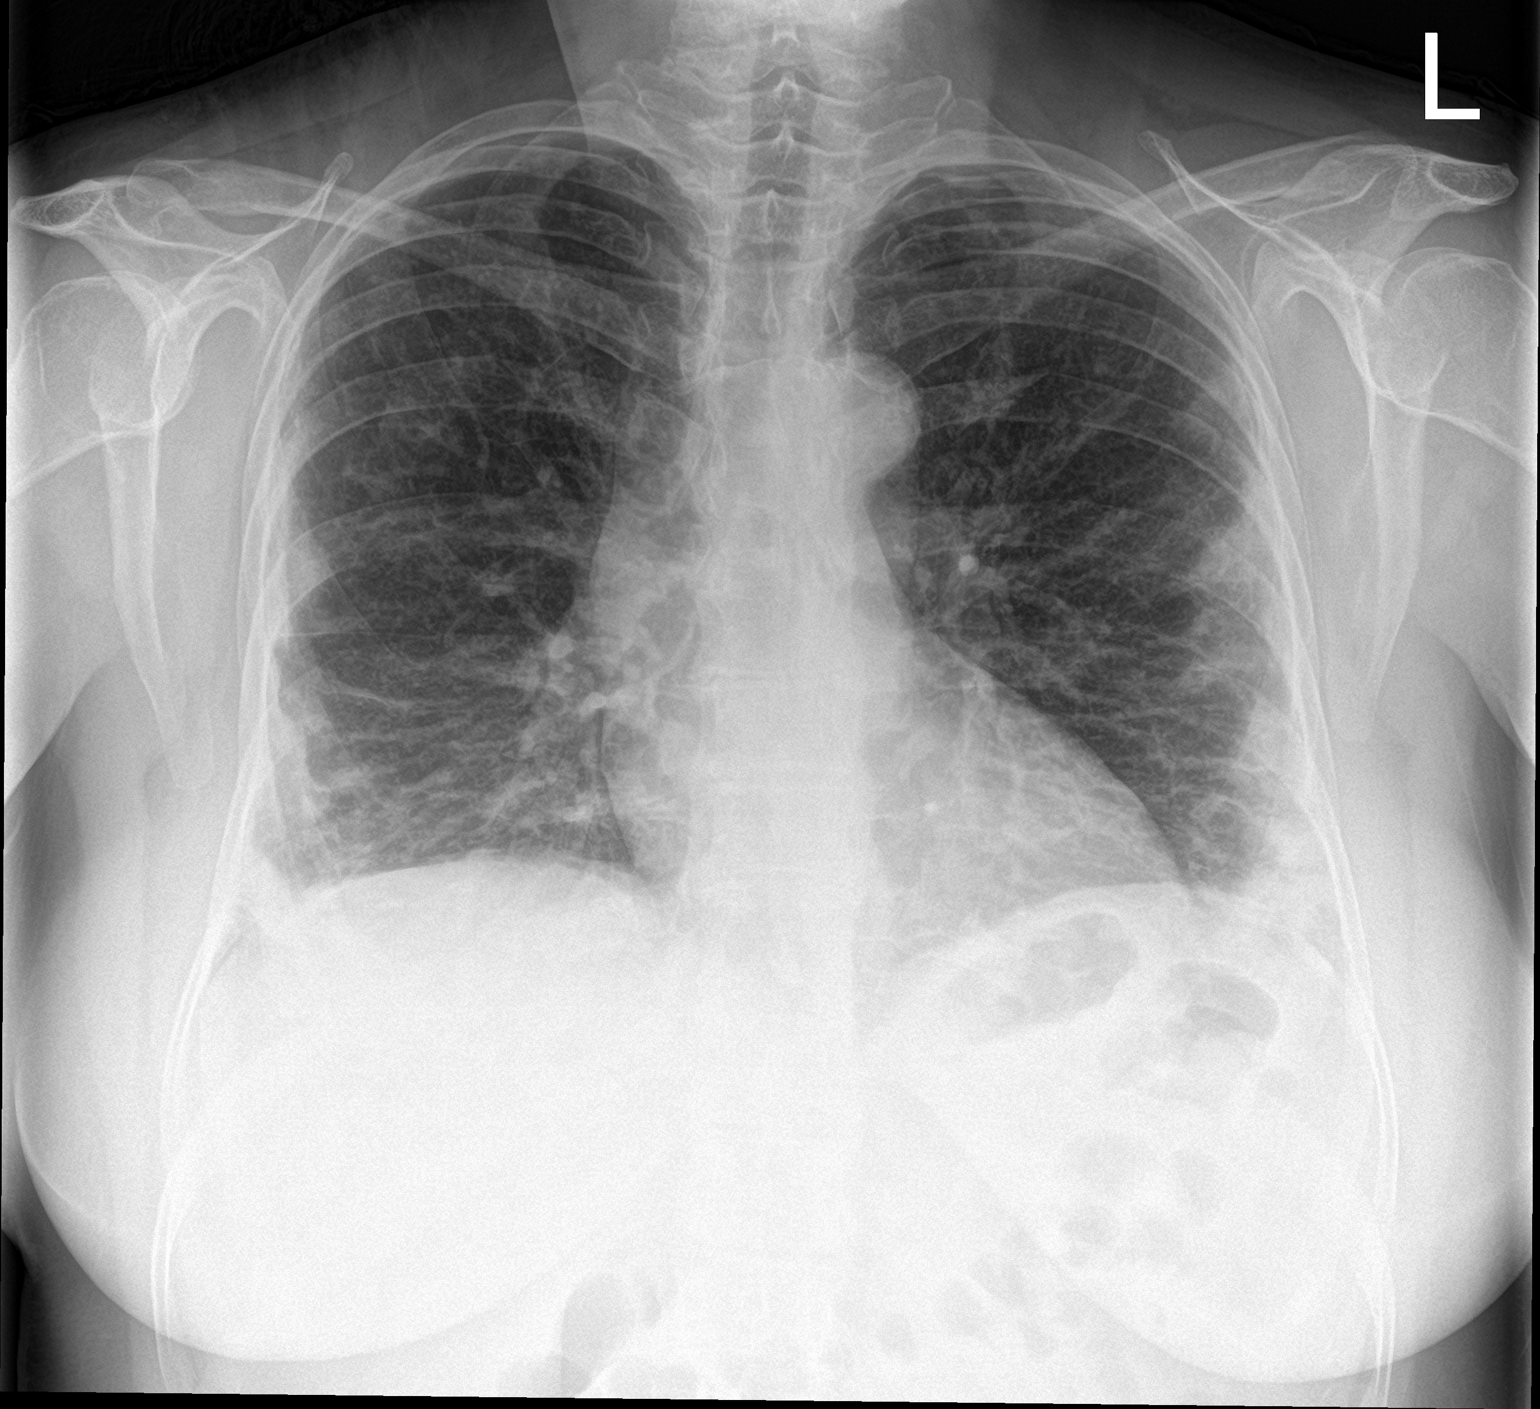

[chest lat]
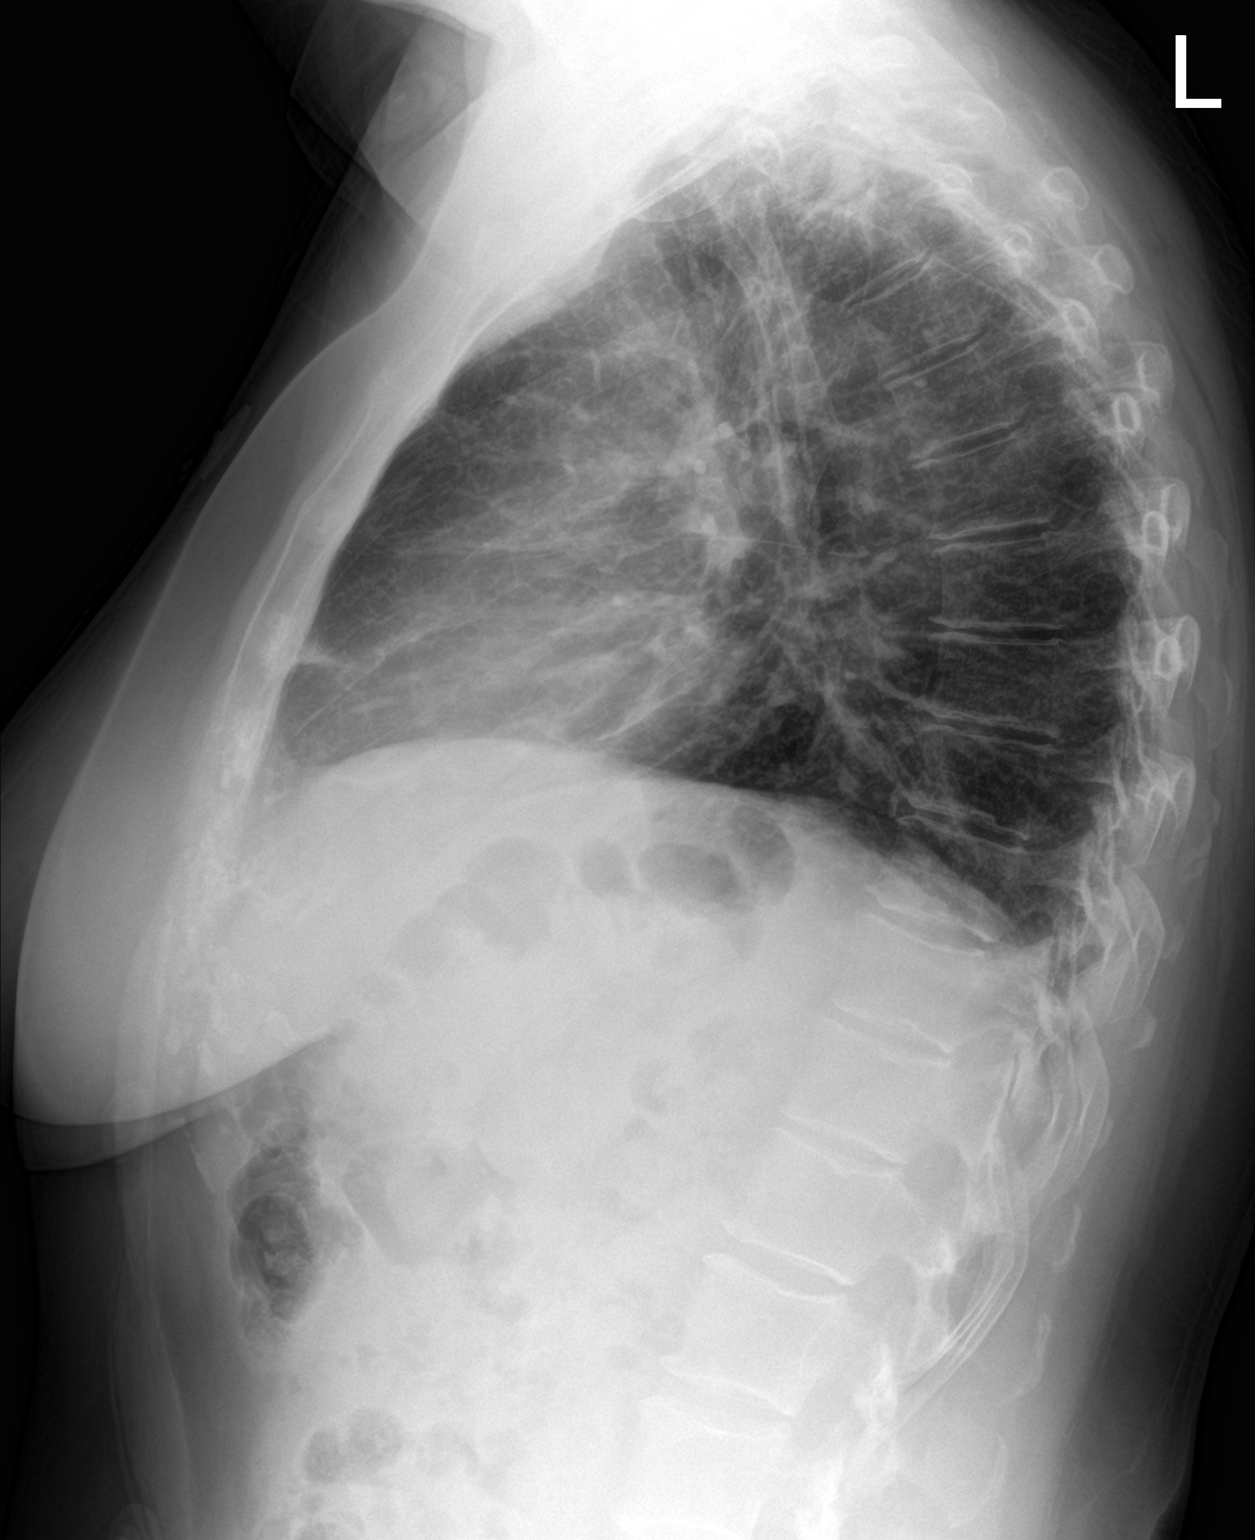

[2 of 2 positions shown; findings below may reference images not displayed]

FINDINGS: Mild patchy infiltrates are seen along the periphery of the mid and
lower lung fields. Mild diffuse chronic appearing increased lung
markings are also noted. There is no evidence of a pleural effusion
or pneumothorax. The heart size and mediastinal contours are within
normal limits. The visualized skeletal structures are unremarkable.
IMPRESSION: Mild patchy peripheral bilateral infiltrates.
